# Patient Record
Sex: Male | Born: 1950 | Race: White | Hispanic: No | Marital: Married | Smoking: Current some day smoker
Health system: Southern US, Community
[De-identification: ages and names within clinical notes are randomized; demographics above are authoritative.]

## PROBLEM LIST (undated history)

## (undated) DIAGNOSIS — K4090 Unilateral inguinal hernia, without obstruction or gangrene, not specified as recurrent: Secondary | ICD-10-CM

## (undated) DIAGNOSIS — E785 Hyperlipidemia, unspecified: Secondary | ICD-10-CM

## (undated) DIAGNOSIS — M549 Dorsalgia, unspecified: Secondary | ICD-10-CM

## (undated) HISTORY — DX: Hyperlipidemia, unspecified: E78.5

## (undated) HISTORY — PX: HERNIA REPAIR: SHX51

## (undated) HISTORY — PX: FINGER SURGERY: SHX640

---

## 2012-11-30 ENCOUNTER — Encounter: Payer: Self-pay | Admitting: Family Medicine

## 2012-11-30 ENCOUNTER — Ambulatory Visit (INDEPENDENT_AMBULATORY_CARE_PROVIDER_SITE_OTHER): Payer: Federal, State, Local not specified - PPO | Admitting: Family Medicine

## 2012-11-30 VITALS — BP 132/73 | HR 89 | Ht 69.0 in | Wt 165.0 lb

## 2012-11-30 DIAGNOSIS — M545 Low back pain: Secondary | ICD-10-CM

## 2012-11-30 DIAGNOSIS — G8929 Other chronic pain: Secondary | ICD-10-CM

## 2012-11-30 MED ORDER — NORTRIPTYLINE HCL 25 MG PO CAPS: 50.0000 mg | ORAL_CAPSULE | Freq: Every day | ORAL | Status: DC

## 2012-11-30 NOTE — Patient Instructions (Signed)
You have chronic low back pain. You've done a majority of the things typically prescribed for this (physical therapy, epidural steroid injections, anti-inflammatories, muscle relaxants, pain medication, discussed surgery). We will try nortriptyline 25mg  at bedtime - in 1 week if you tolerate this can increase to taking 2 capsules at bedtime. Call me for a refill of this because I can send in the higher dose. Take tylenol for baseline pain relief (1-2 extra strength tabs 3x/day) Stay as active as possible. Would still encourage you to do home exercises daily that physical therapy had shown you. Consider repeating the injections in your back, pain management referral. Follow up with me in 1 month.

## 2012-12-11 ENCOUNTER — Encounter: Payer: Self-pay | Admitting: Family Medicine

## 2012-12-11 DIAGNOSIS — G8929 Other chronic pain: Secondary | ICD-10-CM | POA: Insufficient documentation

## 2012-12-11 NOTE — Progress Notes (Signed)
Patient ID: Brendan Caldwell, male   DOB: 08-19-1950, 62 y.o.   MRN: [id number]  PCP: No primary provider on file.  Subjective:   HPI: Patient is a 62 y.o. male here for chronic low back pain.  Patient has had low back pain dating to October 24, 2005. He was caught on a machine chain at work and subsequently twisted his low back. Had radiation and numbness/tingling into right leg with electrical shock sensation. No prior issues with his back Over the past several years he has had extensive workup and treatment of this issue. This included over 4 years of physical therapy, ibuprofen, advil, vicodin, flexeril, darvocet, tramadol, medrol, valium, ESI series x 4, home TENS unit.  They recommended lyrica but was not approved by workers comp. Had 3 separate MRIs; November 26, 2005: partially lumbarized S1, slight retrolisthesis of L3 over L4, L2-3 mild degeneration with disc bulging into inferior neural foramen.  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 August 14, 2007: mild annular bulge at L4-5, small left paracentral disc herniation at L5-S1 contacting left S1 nerve root. January 08, 2009: multilevel DDD.  Small central herniation at L5-S1 but no obvious nerve root impingement or foraminal stenosis. Currently still experiences pain daily around 5-6/10 up to a 10/10 at times. Has to keep changing positions. Still only radiates into his right leg. No numbness or tingling now. Tramadol made him sick to his stomach. No bowel/bladder dysfunction, saddle anesthesia.  Past Medical History  Diagnosis Date  . Hyperlipidemia     No current outpatient prescriptions on file prior to visit.   No current facility-administered medications on file prior to visit.    Past Surgical History  Procedure Laterality Date  . Finger surgery      No Known Allergies  History   Social History  . Marital Status: Unknown    Spouse Name: N/A     Number of Children: N/A  . Years of Education: N/A   Occupational History  . Not on file.   Social History Main Topics  . Smoking status: Current Every Day Smoker  . Smokeless tobacco: Not on file  . Alcohol Use: Not on file  . Drug Use: Not on file  . Sexual Activity: Not on file   Other Topics Concern  . Not on file   Social History Narrative  . No narrative on file    Family History  Problem Relation Age of Onset  . Sudden death Neg Hx   . Hypertension Neg Hx   . Hyperlipidemia Neg Hx   . Heart attack Neg Hx   . Diabetes Neg Hx     BP 132/73  Pulse 89  Ht 5\' 9"  (1.753 m)  Wt 165 lb (74.844 kg)  BMI 24.36 kg/m2  Review of Systems: See HPI above.    Objective:  Physical Exam:  Gen: NAD  Back: No gross deformity, scoliosis. TTP mildly bilateral paraspinal regions..  No midline or bony TTP. FROM with pain on flexion. Strength LEs 5/5 all muscle groups.   2+ MSRs in patellar and achilles tendons, equal bilaterally. Negative SLRs. Sensation intact to light touch bilaterally. Negative logroll bilateral hips Negative fabers and piriformis stretches.    Assessment & Plan:  1. Chronic low back pain - multilevel DDD - does have small disc herniation L5-S1 though that MRI was done 4 years ago, likely changes to this point.  Has had extensive treatment for this including PT, nsaids,  ESIs, muscle relaxanats, pain medication, discussion about surgery.  He has not tried neuropathic medications.  Will start nortriptyline.  Tylenol.  Declined repeating other measures at this time.  Continue home exercises.  Consider pain management referral.  F/u in 1 month.

## 2012-12-11 NOTE — Assessment & Plan Note (Signed)
multilevel DDD - does have small disc herniation L5-S1 though that MRI was done 4 years ago, likely changes to this point.  Has had extensive treatment for this including PT, nsaids, ESIs, muscle relaxanats, pain medication, discussion about surgery.  He has not tried neuropathic medications.  Will start nortriptyline.  Tylenol.  Declined repeating other measures at this time.  Continue home exercises.  Consider pain management referral.  F/u in 1 month.

## 2013-01-01 ENCOUNTER — Ambulatory Visit: Payer: Self-pay | Admitting: Family Medicine

## 2013-03-16 ENCOUNTER — Encounter: Payer: Self-pay | Admitting: Family Medicine

## 2013-03-16 ENCOUNTER — Ambulatory Visit (INDEPENDENT_AMBULATORY_CARE_PROVIDER_SITE_OTHER): Payer: Self-pay | Admitting: Family Medicine

## 2013-03-16 VITALS — BP 148/98 | HR 92 | Ht 69.0 in | Wt 163.8 lb

## 2013-03-16 DIAGNOSIS — M545 Low back pain, unspecified: Secondary | ICD-10-CM | POA: Diagnosis not present

## 2013-03-16 DIAGNOSIS — G8929 Other chronic pain: Secondary | ICD-10-CM

## 2013-03-16 NOTE — Patient Instructions (Signed)
You have chronic low back pain. You've done a majority of the things typically prescribed for this (physical therapy, epidural steroid injections, anti-inflammatories, muscle relaxants, pain medication, discussed surgery). Start nortriptyline 25mg  at bedtime - in 1 week if you tolerate this can increase to taking 2 capsules at bedtime. Call me for a refill of this because I can send in the higher dose. Take tylenol, advil for baseline pain relief (1-2 extra strength tabs 3x/day) Stay as active as possible. Would still encourage you to do home exercises daily that physical therapy had shown you. Consider repeating the injections in your back, pain management referral.

## 2013-03-20 ENCOUNTER — Encounter: Payer: Self-pay | Admitting: Family Medicine

## 2013-03-20 NOTE — Assessment & Plan Note (Signed)
multilevel DDD - does have small disc herniation L5-S1 though that MRI was done 4 years ago, likely changes to this point.  As noted before he has had extensive treatment for this including PT, nsaids, ESIs, muscle relaxanats, pain medication, discussion about surgery.  After further talking about nortriptyline he is willing to try this now.  Continue HEP.  Consider pain management referral, repeating MRI and/or ESIs.  F/u prn.

## 2013-03-20 NOTE — Progress Notes (Signed)
Patient ID: Brendan Caldwell, male   DOB: 1950-04-26, 63 y.o.   MRN: [id number] Patient ID: Brendan Caldwell, male   DOB: Feb 06, 1950, 63 y.o.   MRN: [id number]  PCP: No primary provider on file.  Subjective:   HPI: Patient is a 63 y.o. male here for chronic low back pain.  11/30/12: Patient has had low back pain dating to October 24, 2005. He was caught on a machine chain at work and subsequently twisted his low back. Had radiation and numbness/tingling into right leg with electrical shock sensation. No prior issues with his back Over the past several years he has had extensive workup and treatment of this issue. This included over 4 years of physical therapy, ibuprofen, advil, vicodin, flexeril, darvocet, tramadol, medrol, valium, ESI series x 4, home TENS unit.  They recommended lyrica but was not approved by workers comp. Had 3 separate MRIs; November 26, 2005: partially lumbarized S1, slight retrolisthesis of L3 over L4, L2-3 mild degeneration with disc bulging into inferior neural foramen.  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 August 14, 2007: mild annular bulge at L4-5, small left paracentral disc herniation at L5-S1 contacting left S1 nerve root. January 08, 2009: multilevel DDD.  Small central herniation at L5-S1 but no obvious nerve root impingement or foraminal stenosis. Currently still experiences pain daily around 5-6/10 up to a 10/10 at times. Has to keep changing positions. Still only radiates into his right leg. No numbness or tingling now. Tramadol made him sick to his stomach. No bowel/bladder dysfunction, saddle anesthesia.  03/16/13: Patient reports he hasn't had much change in back pain. Has not taken nortriptyline - family member scared him about this medication. Feels best in evening. Worse while working. Uses advil and TENS unit. Has to take frequent breaks. Pain sometimes into right leg. No  numbness/tingling. No bowel/bladder dysfunction.  Past Medical History  Diagnosis Date  . Hyperlipidemia     Current Outpatient Prescriptions on File Prior to Visit  Medication Sig Dispense Refill  . nortriptyline (PAMELOR) 25 MG capsule Take 2 capsules (50 mg total) by mouth at bedtime.  60 capsule  0   No current facility-administered medications on file prior to visit.    Past Surgical History  Procedure Laterality Date  . Finger surgery      No Known Allergies  History   Social History  . Marital Status: Unknown    Spouse Name: N/A    Number of Children: N/A  . Years of Education: N/A   Occupational History  . Not on file.   Social History Main Topics  . Smoking status: Current Every Day Smoker  . Smokeless tobacco: Not on file  . Alcohol Use: Not on file  . Drug Use: Not on file  . Sexual Activity: Not on file   Other Topics Concern  . Not on file   Social History Narrative  . No narrative on file    Family History  Problem Relation Age of Onset  . Sudden death Neg Hx   . Hypertension Neg Hx   . Hyperlipidemia Neg Hx   . Heart attack Neg Hx   . Diabetes Neg Hx     BP 148/98  Pulse 92  Ht 5\' 9"  (1.753 m)  Wt 163 lb 12.8 oz (74.299 kg)  BMI 24.18 kg/m2  Review of Systems: See HPI above.    Objective:  Physical Exam:  Gen: NAD  Back: No gross deformity, scoliosis.  TTP mildly bilateral paraspinal regions..  No midline or bony TTP. FROM with pain on flexion. Strength LEs 5/5 all muscle groups.   2+ MSRs in patellar and achilles tendons, equal bilaterally. Negative SLRs. Sensation intact to light touch bilaterally. Negative logroll bilateral hips Negative fabers and piriformis stretches.    Assessment & Plan:  1. Chronic low back pain - multilevel DDD - does have small disc herniation L5-S1 though that MRI was done 4 years ago, likely changes to this point.  As noted before he has had extensive treatment for this including PT, nsaids,  ESIs, muscle relaxanats, pain medication, discussion about surgery.  After further talking about nortriptyline he is willing to try this now.  Continue HEP.  Consider pain management referral, repeating MRI and/or ESIs.  F/u prn.

## 2013-03-25 ENCOUNTER — Emergency Department (HOSPITAL_BASED_OUTPATIENT_CLINIC_OR_DEPARTMENT_OTHER)
Admission: EM | Admit: 2013-03-25 | Discharge: 2013-03-25 | Disposition: A | Discharge status: Home / Self Care | Payer: Medicare Other | Attending: Emergency Medicine | Admitting: Emergency Medicine

## 2013-03-25 ENCOUNTER — Emergency Department (HOSPITAL_BASED_OUTPATIENT_CLINIC_OR_DEPARTMENT_OTHER): Payer: Medicare Other

## 2013-03-25 DIAGNOSIS — F17200 Nicotine dependence, unspecified, uncomplicated: Secondary | ICD-10-CM | POA: Insufficient documentation

## 2013-03-25 DIAGNOSIS — F29 Unspecified psychosis not due to a substance or known physiological condition: Secondary | ICD-10-CM | POA: Diagnosis not present

## 2013-03-25 DIAGNOSIS — B349 Viral infection, unspecified: Secondary | ICD-10-CM

## 2013-03-25 DIAGNOSIS — B9789 Other viral agents as the cause of diseases classified elsewhere: Secondary | ICD-10-CM | POA: Diagnosis not present

## 2013-03-25 DIAGNOSIS — Z8639 Personal history of other endocrine, nutritional and metabolic disease: Secondary | ICD-10-CM | POA: Insufficient documentation

## 2013-03-25 DIAGNOSIS — Z862 Personal history of diseases of the blood and blood-forming organs and certain disorders involving the immune mechanism: Secondary | ICD-10-CM | POA: Insufficient documentation

## 2013-03-25 DIAGNOSIS — R197 Diarrhea, unspecified: Secondary | ICD-10-CM | POA: Insufficient documentation

## 2013-03-25 DIAGNOSIS — R5383 Other fatigue: Secondary | ICD-10-CM | POA: Diagnosis not present

## 2013-03-25 DIAGNOSIS — R5381 Other malaise: Secondary | ICD-10-CM | POA: Diagnosis not present

## 2013-03-25 DIAGNOSIS — R4182 Altered mental status, unspecified: Secondary | ICD-10-CM | POA: Diagnosis not present

## 2013-03-25 DIAGNOSIS — R918 Other nonspecific abnormal finding of lung field: Secondary | ICD-10-CM | POA: Diagnosis not present

## 2013-03-25 LAB — CBC WITH DIFFERENTIAL/PLATELET
Basophils Absolute: 0 10*3/uL (ref 0.0–0.1)
Basophils Relative: 0 % (ref 0–1)
Eosinophils Absolute: 0.3 10*3/uL (ref 0.0–0.7)
Eosinophils Relative: 6 % — ABNORMAL HIGH (ref 0–5)
HEMATOCRIT: 44.8 % (ref 39.0–52.0)
HEMOGLOBIN: 15.8 g/dL (ref 13.0–17.0)
LYMPHS PCT: 26 % (ref 12–46)
Lymphs Abs: 1.4 10*3/uL (ref 0.7–4.0)
MCH: 31.7 pg (ref 26.0–34.0)
MCHC: 35.3 g/dL (ref 30.0–36.0)
MCV: 89.8 fL (ref 78.0–100.0)
MONO ABS: 0.7 10*3/uL (ref 0.1–1.0)
MONOS PCT: 12 % (ref 3–12)
NEUTROS ABS: 3.1 10*3/uL (ref 1.7–7.7)
Neutrophils Relative %: 56 % (ref 43–77)
Platelets: 127 10*3/uL — ABNORMAL LOW (ref 150–400)
RBC: 4.99 MIL/uL (ref 4.22–5.81)
RDW: 12.8 % (ref 11.5–15.5)
WBC: 5.5 10*3/uL (ref 4.0–10.5)

## 2013-03-25 LAB — COMPREHENSIVE METABOLIC PANEL
ALT: 17 U/L (ref 0–53)
AST: 19 U/L (ref 0–37)
Albumin: 3.7 g/dL (ref 3.5–5.2)
Alkaline Phosphatase: 59 U/L (ref 39–117)
BILIRUBIN TOTAL: 0.3 mg/dL (ref 0.3–1.2)
BUN: 15 mg/dL (ref 6–23)
CHLORIDE: 102 meq/L (ref 96–112)
CO2: 25 meq/L (ref 19–32)
Calcium: 9.1 mg/dL (ref 8.4–10.5)
Creatinine, Ser: 0.8 mg/dL (ref 0.50–1.35)
GFR calc Af Amer: >90 mL/min (ref >90)
Glucose, Bld: 101 mg/dL — ABNORMAL HIGH (ref 70–99)
Potassium: 4.3 mEq/L (ref 3.7–5.3)
Sodium: 139 mEq/L (ref 137–147)
Total Protein: 6.9 g/dL (ref 6.0–8.3)

## 2013-03-25 LAB — TROPONIN I

## 2013-03-25 MED ORDER — SODIUM CHLORIDE 0.9 % IV BOLUS (SEPSIS)
1000.0000 mL | Freq: Once | INTRAVENOUS | Status: AC
  Administered 2013-03-25: 1000 mL via INTRAVENOUS

## 2013-03-25 NOTE — ED Provider Notes (Signed)
CSN: [id number]     Arrival date & time 03/25/13  2725 History   First MD Initiated Contact with Patient 03/25/13 [phone]     Chief Complaint  Patient presents with  . Influenza     (Consider location/radiation/quality/duration/timing/severity/associated sxs/prior Treatment) HPI Comments: Patient is a 63 year old male with past medical history of high cholesterol. He presents today with complaints of a several day history of not feeling well. He states he has had generalized body aches and chills several days ago he experienced recurrent episodes of diarrhea and also an episode of chest discomfort. He has felt fevered off and on but has not taken his temperature. He woke from sleep this morning stating that he felt "confused". He stated he is having difficulty identifying his surroundings and was uncertain as to where he was. He woke his wife up who in turn brought him here. He denies to me he is having any headache, fever, or stiff neck. He denies any weakness or numbness in his arms or legs. He states he feels as though he was "by a truck".  Patient is a 63 y.o. male presenting with flu symptoms. The history is provided by the patient.  Influenza Presenting symptoms: cough, diarrhea, fatigue and headache   Presenting symptoms: no fever   Severity:  Moderate Onset quality:  Sudden Duration:  3 days Progression:  Worsening Chronicity:  New Associated symptoms: chills     Past Medical History  Diagnosis Date  . Hyperlipidemia    Past Surgical History  Procedure Laterality Date  . Finger surgery     Family History  Problem Relation Age of Onset  . Sudden death Neg Hx   . Hypertension Neg Hx   . Hyperlipidemia Neg Hx   . Heart attack Neg Hx   . Diabetes Neg Hx    History  Substance Use Topics  . Smoking status: Current Every Day Smoker  . Smokeless tobacco: Not on file  . Alcohol Use: Not on file    Review of Systems  Constitutional: Positive for chills and fatigue. Negative  for fever.  Respiratory: Positive for cough.   Gastrointestinal: Positive for diarrhea.  Neurological: Positive for headaches.  All other systems reviewed and are negative.      Allergies  Review of patient's allergies indicates no known allergies.  Home Medications   Current Outpatient Rx  Name  Route  Sig  Dispense  Refill  . nortriptyline (PAMELOR) 25 MG capsule   Oral   Take 2 capsules (50 mg total) by mouth at bedtime.   60 capsule   0    BP 118/69  Pulse 93  Temp(Src) 97 F (36.1 C) (Oral)  Resp 20  Ht 5\' 9"  (1.753 m)  Wt 167 lb (75.751 kg)  BMI 24.65 kg/m2  SpO2 97% Physical Exam  Nursing note and vitals reviewed. Constitutional: He is oriented to person, place, and time. He appears well-developed and well-nourished. No distress.  HENT:  Head: Normocephalic and atraumatic.  Mouth/Throat: Oropharynx is clear and moist.  Eyes: EOM are normal. Pupils are equal, round, and reactive to light.  Neck: Normal range of motion. Neck supple.  Cardiovascular: Normal rate, regular rhythm and normal heart sounds.   No murmur heard. Pulmonary/Chest: Effort normal and breath sounds normal. No respiratory distress. He has no wheezes.  Abdominal: Soft. Bowel sounds are normal. He exhibits no distension. There is no tenderness.  Musculoskeletal: Normal range of motion. He exhibits no edema.  Lymphadenopathy:    He  has no cervical adenopathy.  Neurological: He is alert and oriented to person, place, and time. No cranial nerve deficit. He exhibits normal muscle tone. Coordination normal.  Skin: Skin is warm and dry. He is not diaphoretic.    ED Course  Procedures (including critical care time) Labs Review Labs Reviewed  CBC WITH DIFFERENTIAL  COMPREHENSIVE METABOLIC PANEL  TROPONIN I   Imaging Review No results found.  EKG Interpretation    Date/Time:  Sunday March 25 2013 04:12:48 EST Ventricular Rate:  80 PR Interval:  146 QRS Duration: 84 QT  Interval:  350 QTC Calculation: 403 R Axis:   67 Text Interpretation:  Normal sinus rhythm Normal ECG Confirmed by DELOS  MD, Palyn Scrima ([id number]) on 03/25/2013 4:23:24 AM            MDM   Final diagnoses:  None    Patient is a 64 year old male no significant past medical history. He presents with complaints of generalized malaise, some diarrhea, and an episode of confusion that occurred when awakening was morning to go to the bathroom. His neurologic examination is nonfocal and workup is unremarkable. There is no elevation of white count, electrolytes are normal, CT of the head is unremarkable and chest x-ray is clear. I suspect his symptoms are viral in nature and will recommend plenty of fluids, rest, and time. He is to followup next week with his primary Dr. And return to the ER if his symptoms substantially worsen or change.    Veryl Speak, MD 03/25/13 [phone]

## 2013-03-25 NOTE — Discharge Instructions (Signed)
Drink plenty of fluids and get plenty of rest for the next several days. Return to the emergency department if you develop high fever, severe headache, or other new and concerning symptoms.   Viral Infections A viral infection can be caused by different types of viruses.Most viral infections are not serious and resolve on their own. However, some infections may cause severe symptoms and may lead to further complications. SYMPTOMS Viruses can frequently cause:  Minor sore throat.  Aches and pains.  Headaches.  Runny nose.  Different types of rashes.  Watery eyes.  Tiredness.  Cough.  Loss of appetite.  Gastrointestinal infections, resulting in nausea, vomiting, and diarrhea. These symptoms do not respond to antibiotics because the infection is not caused by bacteria. However, you might catch a bacterial infection following the viral infection. This is sometimes called a "superinfection." Symptoms of such a bacterial infection may include:  Worsening sore throat with pus and difficulty swallowing.  Swollen neck glands.  Chills and a high or persistent fever.  Severe headache.  Tenderness over the sinuses.  Persistent overall ill feeling (malaise), muscle aches, and tiredness (fatigue).  Persistent cough.  Yellow, green, or brown mucus production with coughing. HOME CARE INSTRUCTIONS   Only take over-the-counter or prescription medicines for pain, discomfort, diarrhea, or fever as directed by your caregiver.  Drink enough water and fluids to keep your urine clear or pale yellow. Sports drinks can provide valuable electrolytes, sugars, and hydration.  Get plenty of rest and maintain proper nutrition. Soups and broths with crackers or rice are fine. SEEK IMMEDIATE MEDICAL CARE IF:   You have severe headaches, shortness of breath, chest pain, neck pain, or an unusual rash.  You have uncontrolled vomiting, diarrhea, or you are unable to keep down fluids.  You or  your child has an oral temperature above 102 F (38.9 C), not controlled by medicine.  Your baby is older than 3 months with a rectal temperature of 102 F (38.9 C) or higher.  Your baby is 65 months old or younger with a rectal temperature of 100.4 F (38 C) or higher. MAKE SURE YOU:   Understand these instructions.  Will watch your condition.  Will get help right away if you are not doing well or get worse. Document Released: 10/28/2004 Document Revised: 04/12/2011 Document Reviewed: 05/25/2010 [facility] Patient Information 2014 [place], [place].

## 2013-03-25 NOTE — ED Notes (Signed)
Pt reports flu like symptoms x 1 week with fever body aches and chills  Awoke from sleep confused and disoriented

## 2013-03-30 ENCOUNTER — Emergency Department (HOSPITAL_BASED_OUTPATIENT_CLINIC_OR_DEPARTMENT_OTHER)
Admission: EM | Admit: 2013-03-30 | Discharge: 2013-03-30 | Disposition: A | Discharge status: Home / Self Care | Payer: Medicare Other | Attending: Emergency Medicine | Admitting: Emergency Medicine

## 2013-03-30 ENCOUNTER — Encounter (HOSPITAL_BASED_OUTPATIENT_CLINIC_OR_DEPARTMENT_OTHER): Payer: Self-pay | Admitting: Emergency Medicine

## 2013-03-30 DIAGNOSIS — Z79899 Other long term (current) drug therapy: Secondary | ICD-10-CM | POA: Diagnosis not present

## 2013-03-30 DIAGNOSIS — F17200 Nicotine dependence, unspecified, uncomplicated: Secondary | ICD-10-CM | POA: Diagnosis not present

## 2013-03-30 DIAGNOSIS — Z8639 Personal history of other endocrine, nutritional and metabolic disease: Secondary | ICD-10-CM | POA: Diagnosis not present

## 2013-03-30 DIAGNOSIS — Z862 Personal history of diseases of the blood and blood-forming organs and certain disorders involving the immune mechanism: Secondary | ICD-10-CM | POA: Diagnosis not present

## 2013-03-30 DIAGNOSIS — L408 Other psoriasis: Secondary | ICD-10-CM | POA: Insufficient documentation

## 2013-03-30 DIAGNOSIS — K625 Hemorrhage of anus and rectum: Secondary | ICD-10-CM

## 2013-03-30 LAB — CBC
HCT: 42.4 % (ref 39.0–52.0)
HEMOGLOBIN: 15.1 g/dL (ref 13.0–17.0)
MCH: 32 pg (ref 26.0–34.0)
MCHC: 35.6 g/dL (ref 30.0–36.0)
MCV: 89.8 fL (ref 78.0–100.0)
Platelets: 154 10*3/uL (ref 150–400)
RBC: 4.72 MIL/uL (ref 4.22–5.81)
RDW: 12.6 % (ref 11.5–15.5)
WBC: 10.9 10*3/uL — AB (ref 4.0–10.5)

## 2013-03-30 LAB — OCCULT BLOOD X 1 CARD TO LAB, STOOL: FECAL OCCULT BLD: POSITIVE — AB

## 2013-03-30 NOTE — ED Notes (Addendum)
Pt c/o rectal bleeding bright red and dark red since this morning, denies pain. Pt sts bleeding was present 2wks ago also and resolved after 2 days. Pt has never had a colonoscopy. Pt sts he continues to feel tired but sts he had the flu recently. Pt also reports starting new medication 5 days ago.

## 2013-03-30 NOTE — Discharge Instructions (Signed)
Gastrointestinal Bleeding °Gastrointestinal (GI) bleeding means there is bleeding somewhere along the digestive tract, between the mouth and anus. °CAUSES  °There are many different problems that can cause GI bleeding. Possible causes include: °· Esophagitis. This is inflammation, irritation, or swelling of the esophagus. °· Hemorrhoids. These are veins that are full of blood (engorged) in the rectum. They cause pain, inflammation, and may bleed. °· Anal fissures. These are areas of painful tearing which may bleed. They are often caused by passing hard stool. °· Diverticulosis. These are pouches that form on the colon over time, with age, and may bleed significantly. °· Diverticulitis. This is inflammation in areas with diverticulosis. It can cause pain, fever, and bloody stools, although bleeding is rare. °· Polyps and cancer. Colon cancer often starts out as precancerous polyps. °· Gastritis and ulcers. Bleeding from the upper gastrointestinal tract (near the stomach) may travel through the intestines and produce black, sometimes tarry, often bad smelling stools. In certain cases, if the bleeding is fast enough, the stools may not be black, but red. This condition may be life-threatening. °SYMPTOMS  °· Vomiting bright red blood or material that looks like coffee grounds. °· Bloody, black, or tarry stools. °DIAGNOSIS  °Your caregiver may diagnose your condition by taking your history and performing a physical exam. More tests may be needed, including: °· X-rays and other imaging tests. °· Esophagogastroduodenoscopy (EGD). This test uses a flexible, lighted tube to look at your esophagus, stomach, and small intestine. °· Colonoscopy. This test uses a flexible, lighted tube to look at your colon. °TREATMENT  °Treatment depends on the cause of your bleeding.  °· For bleeding from the esophagus, stomach, small intestine, or colon, the caregiver doing your EGD or colonoscopy may be able to stop the bleeding as part of  the procedure. °· Inflammation or infection of the colon can be treated with medicines. °· Many rectal problems can be treated with creams, suppositories, or warm baths. °· Surgery is sometimes needed. °· Blood transfusions are sometimes needed if you have lost a lot of blood. °If bleeding is slow, you may be allowed to go home. If there is a lot of bleeding, you will need to stay in the hospital for observation. °HOME CARE INSTRUCTIONS  °· Take any medicines exactly as prescribed. °· Keep your stools soft by eating foods that are high in fiber. These foods include whole grains, legumes, fruits, and vegetables. Prunes (1 to 3 a day) work well for many people. °· Drink enough fluids to keep your urine clear or pale yellow. °SEEK IMMEDIATE MEDICAL CARE IF:  °· Your bleeding increases. °· You feel lightheaded, weak, or you faint. °· You have severe cramps in your back or abdomen. °· You pass large blood clots in your stool. °· Your problems are getting worse. °MAKE SURE YOU:  °· Understand these instructions. °· Will watch your condition. °· Will get help right away if you are not doing well or get worse. °Document Released: 01/16/2000 Document Revised: 01/05/2012 Document Reviewed: 12/28/2010 °[employer]® Patient Information ©2014 [employer]. ° °

## 2013-03-30 NOTE — ED Provider Notes (Signed)
CSN: [id number]     Arrival date & time 03/30/13  1701 History   First MD Initiated Contact with Patient 03/30/13 1703     Chief Complaint  Patient presents with  . Rectal Bleeding     (Consider location/radiation/quality/duration/timing/severity/associated sxs/prior Treatment) Patient is a 63 y.o. male presenting with hematochezia.  Rectal Bleeding Associated symptoms: no abdominal pain and no vomiting    patient states that he is has had some rectal bleeding for a month. He states it is sometimes red blood and sometimes a little darker. He states he thinks it is mixed with stool, however he does not even like looking at. He states his been worse over last couple days. No other bleeding. He states he's also had some fatigue. He states he has a previous history hemorrhoids. He states he recently had the flu it is not felt better since. No hematemesis. No cough.  Past Medical History  Diagnosis Date  . Hyperlipidemia    Past Surgical History  Procedure Laterality Date  . Finger surgery     Family History  Problem Relation Age of Onset  . Sudden death Neg Hx   . Hypertension Neg Hx   . Hyperlipidemia Neg Hx   . Heart attack Neg Hx   . Diabetes Neg Hx    History  Substance Use Topics  . Smoking status: Current Every Day Smoker  . Smokeless tobacco: Not on file  . Alcohol Use: No    Review of Systems  Constitutional: Positive for fatigue. Negative for activity change and appetite change.  Eyes: Negative for pain.  Respiratory: Negative for chest tightness and shortness of breath.   Cardiovascular: Negative for chest pain and leg swelling.  Gastrointestinal: Positive for blood in stool, hematochezia and anal bleeding. Negative for nausea, vomiting, abdominal pain, diarrhea and rectal pain.  Genitourinary: Negative for flank pain.  Musculoskeletal: Negative for back pain and neck stiffness.  Skin: Negative for pallor and rash.  Neurological: Negative for weakness, numbness  and headaches.  Psychiatric/Behavioral: Negative for behavioral problems.      Allergies  Review of patient's allergies indicates no known allergies.  Home Medications   Current Outpatient Rx  Name  Route  Sig  Dispense  Refill  . nortriptyline (PAMELOR) 25 MG capsule   Oral   Take 2 capsules (50 mg total) by mouth at bedtime.   60 capsule   0    BP 151/83  Pulse 94  Temp(Src) 97.5 F (36.4 C) (Oral)  Resp 18  SpO2 100% Physical Exam  Constitutional: He appears well-developed and well-nourished.  HENT:  Head: Normocephalic.  Cardiovascular: Normal rate and regular rhythm.   Pulmonary/Chest: Effort normal and breath sounds normal.  Abdominal: Soft. There is no tenderness.  Musculoskeletal: Normal range of motion.  Neurological: He is alert.  Skin: Rash noted. No erythema.  Psoriasis   rectal exam shows some psoriasis in his gluteal area. No frank blood on rectal exam, however guaiac positive.  ED Course  Procedures (including critical care time) Labs Review Labs Reviewed  CBC - Abnormal; Notable for the following:    WBC 10.9 (*)    All other components within normal limits  OCCULT BLOOD X 1 CARD TO LAB, STOOL   Imaging Review No results found.  EKG Interpretation  None  MDM   Final diagnoses:  None    Patient comes with rectal bleeding over the last month. Hemoglobin reassuring and stable. Will discharge home with GI followup.  Jasper Riling. Alvino Chapel, MD 03/30/13 [phone]

## 2013-04-10 DIAGNOSIS — K921 Melena: Secondary | ICD-10-CM | POA: Diagnosis not present

## 2013-04-10 DIAGNOSIS — Z1211 Encounter for screening for malignant neoplasm of colon: Secondary | ICD-10-CM | POA: Diagnosis not present

## 2013-04-16 ENCOUNTER — Encounter (HOSPITAL_BASED_OUTPATIENT_CLINIC_OR_DEPARTMENT_OTHER): Payer: Self-pay | Admitting: Emergency Medicine

## 2013-04-16 DIAGNOSIS — F17200 Nicotine dependence, unspecified, uncomplicated: Secondary | ICD-10-CM | POA: Diagnosis not present

## 2013-04-16 DIAGNOSIS — K089 Disorder of teeth and supporting structures, unspecified: Secondary | ICD-10-CM | POA: Diagnosis not present

## 2013-04-16 DIAGNOSIS — K08409 Partial loss of teeth, unspecified cause, unspecified class: Secondary | ICD-10-CM | POA: Diagnosis not present

## 2013-04-16 DIAGNOSIS — Z8639 Personal history of other endocrine, nutritional and metabolic disease: Secondary | ICD-10-CM | POA: Insufficient documentation

## 2013-04-16 DIAGNOSIS — Z862 Personal history of diseases of the blood and blood-forming organs and certain disorders involving the immune mechanism: Secondary | ICD-10-CM | POA: Insufficient documentation

## 2013-04-16 NOTE — ED Notes (Signed)
Dental pain. States the left side of his face is swollen. He needs pain medication. States he has an appointment in the am at 9:30 with his dentist

## 2013-04-17 ENCOUNTER — Emergency Department (HOSPITAL_BASED_OUTPATIENT_CLINIC_OR_DEPARTMENT_OTHER)
Admission: EM | Admit: 2013-04-17 | Discharge: 2013-04-17 | Disposition: A | Discharge status: Home / Self Care | Payer: Medicare Other | Attending: Emergency Medicine | Admitting: Emergency Medicine

## 2013-04-17 DIAGNOSIS — K0889 Other specified disorders of teeth and supporting structures: Secondary | ICD-10-CM

## 2013-04-17 MED ORDER — HYDROCODONE-ACETAMINOPHEN 5-325 MG PO TABS
1.0000 | ORAL_TABLET | ORAL | Status: DC | PRN
Start: 2013-04-17 — End: 2016-06-25

## 2013-04-17 MED ORDER — PENICILLIN V POTASSIUM 250 MG PO TABS
500.0000 mg | ORAL_TABLET | Freq: Once | ORAL | Status: AC
  Administered 2013-04-17: 500 mg via ORAL
  Filled 2013-04-17: qty 2

## 2013-04-17 MED ORDER — PENICILLIN V POTASSIUM 500 MG PO TABS: 500.0000 mg | ORAL_TABLET | Freq: Three times a day (TID) | ORAL | Status: DC

## 2013-04-17 NOTE — ED Provider Notes (Signed)
Medical screening examination/treatment/procedure(s) were performed by non-physician practitioner and as supervising physician I was immediately available for consultation/collaboration.   EKG Interpretation None        Wynetta Fines, MD 04/17/13 [phone]

## 2013-04-17 NOTE — ED Provider Notes (Signed)
CSN: [id number]     Arrival date & time 04/16/13  2145 History   First MD Initiated Contact with Patient 04/17/13 0032     Chief Complaint  Patient presents with  . Dental Pain     (Consider location/radiation/quality/duration/timing/severity/associated sxs/prior Treatment) Patient is a 63 y.o. male presenting with tooth pain. The history is provided by the patient. No language interpreter was used.  Dental Pain Location:  Lower Lower teeth location:  19/LL 1st molar Severity:  Moderate Associated symptoms: facial swelling   Associated symptoms: no fever   Associated symptoms comment:  Pain at #19, site of crown placement that, per patient, did not undergo root canal prior to crowning. He reports facial swelling. No fever. Increasing pain over the last 2 days.   Past Medical History  Diagnosis Date  . Hyperlipidemia    Past Surgical History  Procedure Laterality Date  . Finger surgery     Family History  Problem Relation Age of Onset  . Sudden death Neg Hx   . Hypertension Neg Hx   . Hyperlipidemia Neg Hx   . Heart attack Neg Hx   . Diabetes Neg Hx    History  Substance Use Topics  . Smoking status: Current Every Day Smoker  . Smokeless tobacco: Not on file  . Alcohol Use: No    Review of Systems  Constitutional: Negative for fever and chills.  HENT: Positive for dental problem and facial swelling. Negative for ear pain and trouble swallowing.        Toothache.  Gastrointestinal: Negative.  Negative for nausea.  Musculoskeletal: Negative for myalgias.  Neurological: Negative.       Allergies  Review of patient's allergies indicates no known allergies.  Home Medications   Current Outpatient Rx  Name  Route  Sig  Dispense  Refill  . nortriptyline (PAMELOR) 25 MG capsule   Oral   Take 2 capsules (50 mg total) by mouth at bedtime.   60 capsule   0    BP 152/88  Pulse 87  Temp(Src) 97.7 F (36.5 C) (Oral)  Resp 20  Ht 5\' 9"  (1.753 m)  Wt 167 lb  (75.751 kg)  BMI 24.65 kg/m2  SpO2 100% Physical Exam  Constitutional: He is oriented to person, place, and time. He appears well-developed and well-nourished.  HENT:  Partially edentulous. #19 with moderate surrounding swelling, mild redness to gingiva. No pointing abscess. No facial swelling.   Neck: Normal range of motion.  Pulmonary/Chest: Effort normal.  Musculoskeletal: Normal range of motion.  Lymphadenopathy:    He has no cervical adenopathy.  Neurological: He is alert and oriented to person, place, and time.  Skin: Skin is warm and dry.  Psychiatric: He has a normal mood and affect.    ED Course  Procedures (including critical care time) Labs Review Labs Reviewed - No data to display Imaging Review No results found.   EKG Interpretation None      MDM   Final diagnoses:  None    1. Dental pain   Uncomplicated dental pain.    Dewaine Oats, PA-C 04/17/13 [phone]

## 2013-04-17 NOTE — Discharge Instructions (Signed)
Dental Care and Dentist Visits Dental care supports good overall health. Regular dental visits can also help you avoid dental pain, bleeding, infection, and other more serious health problems in the future. It is important to keep the mouth healthy because diseases in the teeth, gums, and other oral tissues can spread to other areas of the body. Some problems, such as diabetes, heart disease, and pre-term labor have been associated with poor oral health.  See your dentist every 6 months. If you experience emergency problems such as a toothache or broken tooth, go to the dentist right away. If you see your dentist regularly, you may catch problems early. It is easier to be treated for problems in the early stages.  WHAT TO EXPECT AT A DENTIST VISIT  Your dentist will look for many common oral health problems and recommend proper treatment. At your regular dental visit, you can expect:  Gentle cleaning of the teeth and gums. This includes scraping and polishing. This helps to remove the sticky substance around the teeth and gums (plaque). Plaque forms in the mouth shortly after eating. Over time, plaque hardens on the teeth as tartar. If tartar is not removed regularly, it can cause problems. Cleaning also helps remove stains.  Periodic X-rays. These pictures of the teeth and supporting bone will help your dentist assess the health of your teeth.  Periodic fluoride treatments. Fluoride is a natural mineral shown to help strengthen teeth. Fluoride treatmentinvolves applying a fluoride gel or varnish to the teeth. It is most commonly done in children.  Examination of the mouth, tongue, jaws, teeth, and gums to look for any oral health problems, such as:  Cavities (dental caries). This is decay on the tooth caused by plaque, sugar, and acid in the mouth. It is best to catch a cavity when it is small.  Inflammation of the gums caused by plaque buildup (gingivitis).  Problems with the mouth or malformed  or misaligned teeth.  Oral cancer or other diseases of the soft tissues or jaws. KEEP YOUR TEETH AND GUMS HEALTHY For healthy teeth and gums, follow these general guidelines as well as your dentist's specific advice:  Have your teeth professionally cleaned at the dentist every 6 months.  Brush twice daily with a fluoride toothpaste.  Floss your teeth daily.  Ask your dentist if you need fluoride supplements, treatments, or fluoride toothpaste.  Eat a healthy diet. Reduce foods and drinks with added sugar.  Avoid smoking. TREATMENT FOR ORAL HEALTH PROBLEMS If you have oral health problems, treatment varies depending on the conditions present in your teeth and gums.  Your caregiver will most likely recommend good oral hygiene at each visit.  For cavities, gingivitis, or other oral health disease, your caregiver will perform a procedure to treat the problem. This is typically done at a separate appointment. Sometimes your caregiver will refer you to another dental specialist for specific tooth problems or for surgery. SEEK IMMEDIATE DENTAL CARE IF:  You have pain, bleeding, or soreness in the gum, tooth, jaw, or mouth area.  A permanent tooth becomes loose or separated from the gum socket.  You experience a blow or injury to the mouth or jaw area. Document Released: 09/30/2010 Document Revised: 04/12/2011 Document Reviewed: 09/30/2010 [facility] Patient Information 2014 [place], [place].  Dental Pain A tooth ache may be caused by cavities (tooth decay). Cavities expose the nerve of the tooth to air and hot or cold temperatures. It may come from an infection or abscess (also called a  boil or furuncle) around your tooth. It is also often caused by dental caries (tooth decay). This causes the pain you are having. DIAGNOSIS  Your caregiver can diagnose this problem by exam. TREATMENT   If caused by an infection, it may be treated with medications which kill germs (antibiotics) and pain  medications as prescribed by your caregiver. Take medications as directed.  Only take over-the-counter or prescription medicines for pain, discomfort, or fever as directed by your caregiver.  Whether the tooth ache today is caused by infection or dental disease, you should see your dentist as soon as possible for further care. SEEK MEDICAL CARE IF: The exam and treatment you received today has been provided on an emergency basis only. This is not a substitute for complete medical or dental care. If your problem worsens or new problems (symptoms) appear, and you are unable to meet with your dentist, call or return to this location. SEEK IMMEDIATE MEDICAL CARE IF:   You have a fever.  You develop redness and swelling of your face, jaw, or neck.  You are unable to open your mouth.  You have severe pain uncontrolled by pain medicine. MAKE SURE YOU:   Understand these instructions.  Will watch your condition.  Will get help right away if you are not doing well or get worse. Document Released: 01/18/2005 Document Revised: 04/12/2011 Document Reviewed: 09/06/2007 [facility] Patient Information 2014 [place].

## 2013-05-23 DIAGNOSIS — K921 Melena: Secondary | ICD-10-CM | POA: Diagnosis not present

## 2013-05-23 DIAGNOSIS — K5730 Diverticulosis of large intestine without perforation or abscess without bleeding: Secondary | ICD-10-CM | POA: Diagnosis not present

## 2013-05-23 DIAGNOSIS — Z1211 Encounter for screening for malignant neoplasm of colon: Secondary | ICD-10-CM | POA: Diagnosis not present

## 2013-05-23 DIAGNOSIS — D126 Benign neoplasm of colon, unspecified: Secondary | ICD-10-CM | POA: Diagnosis not present

## 2015-01-04 IMAGING — CR DG CHEST 2V
2 series · 2 of 2 positions shown · non-contrast
Comparison: None available for comparison at time of study
interpretation.

CLINICAL DATA: Flu like symptoms for 1 week.

EXAM:
CHEST  2 VIEW

[w chest pa]
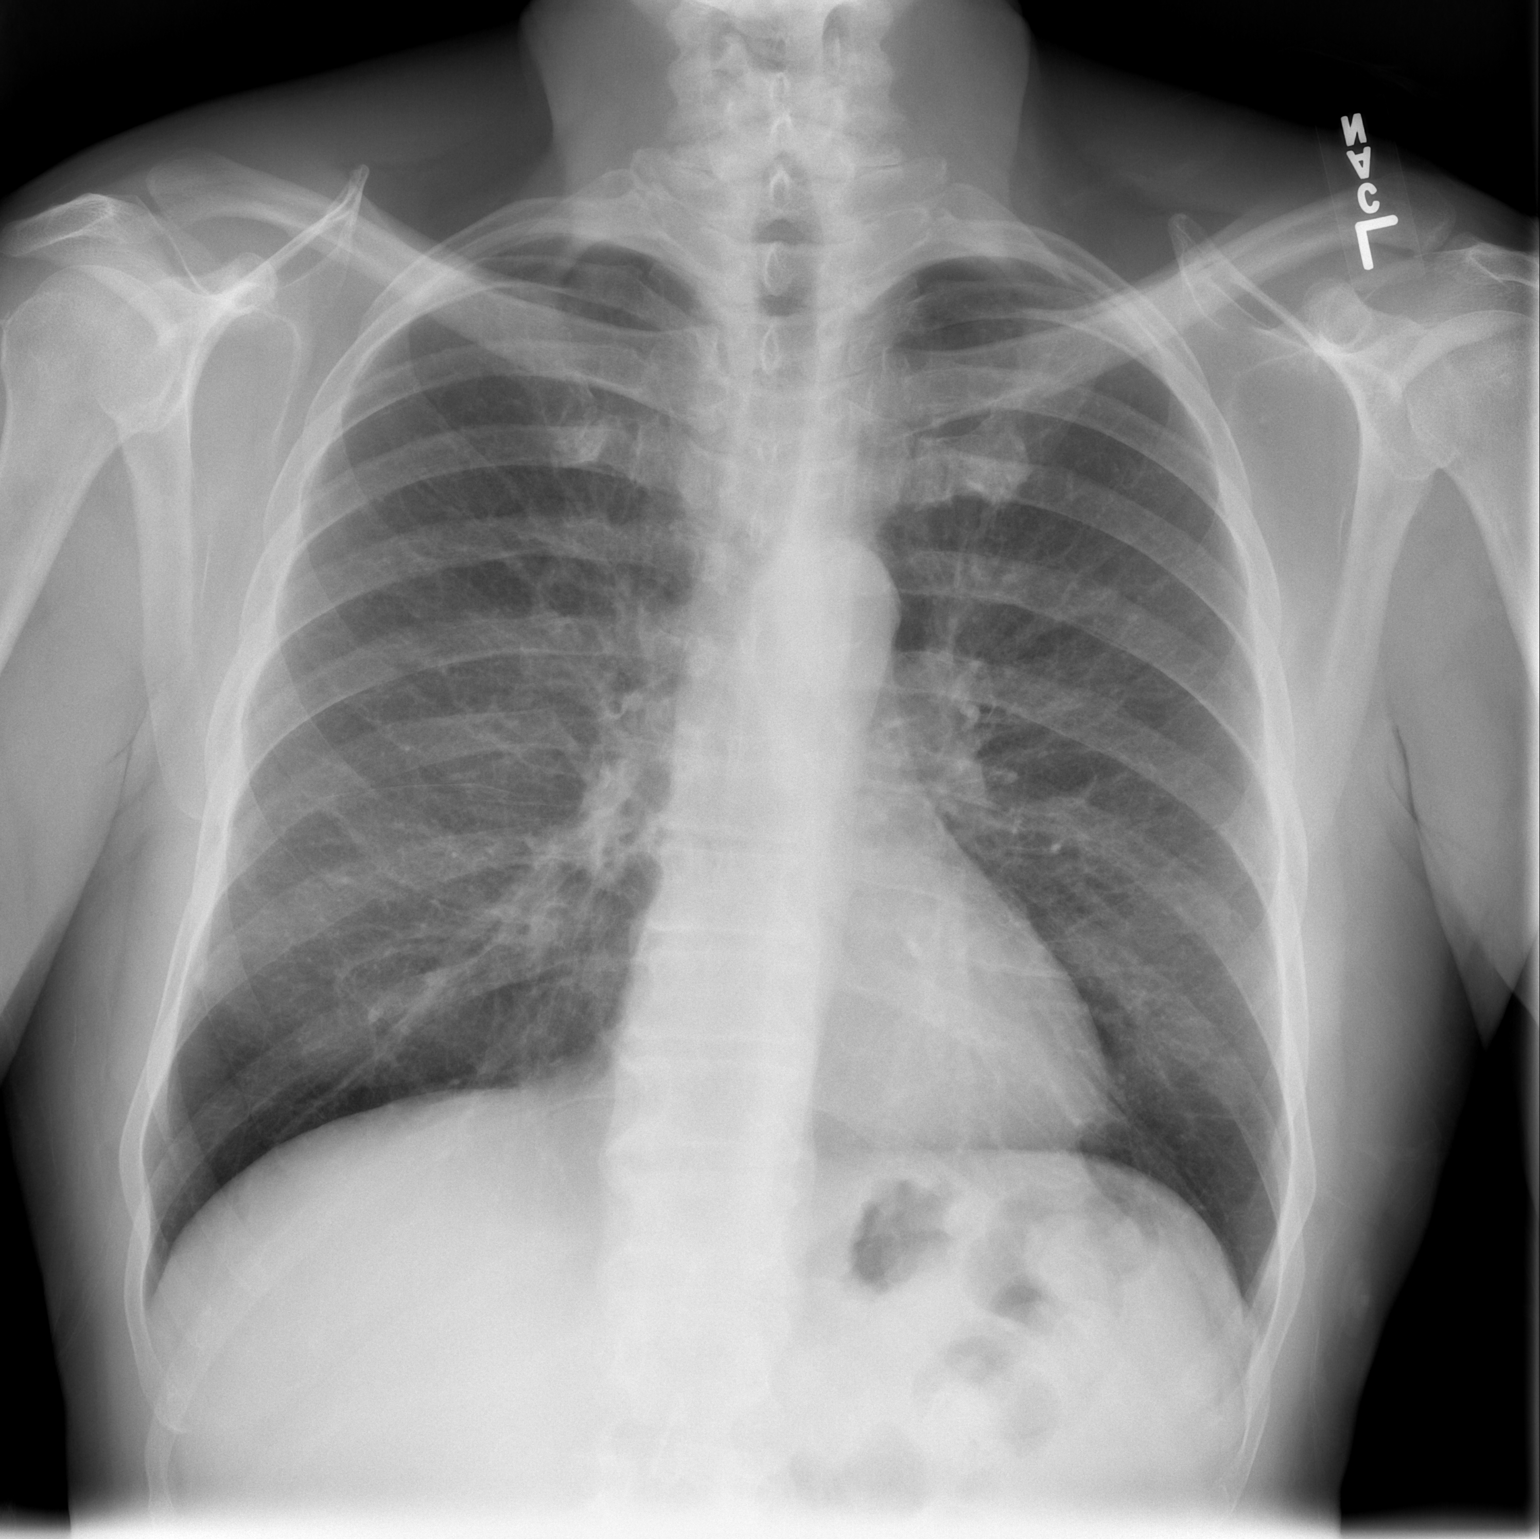

[w chest lat]
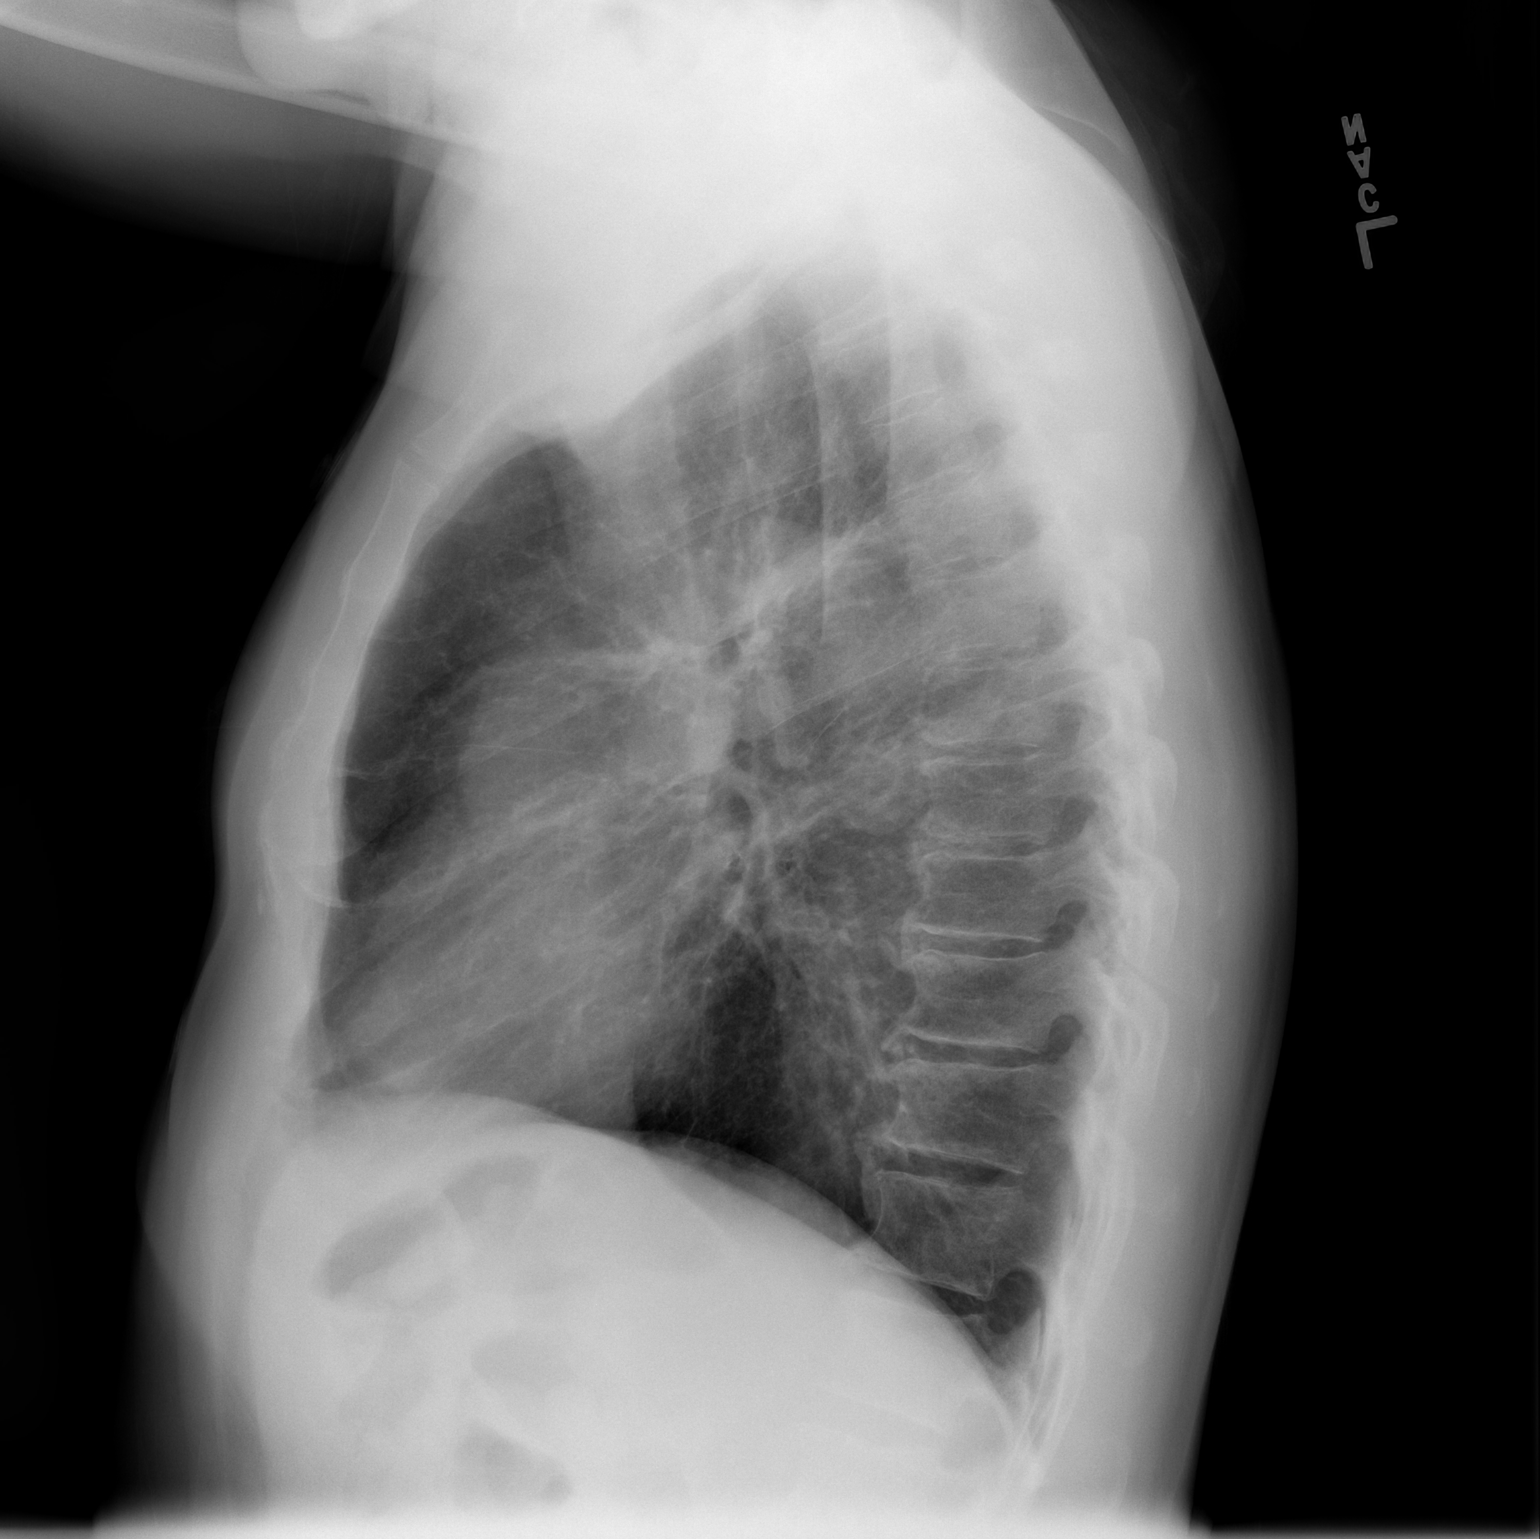

[2 of 2 positions shown; findings below may reference images not displayed]

FINDINGS: Cardiomediastinal silhouette is unremarkable. The lungs are clear
without pleural effusions or focal consolidations. Trachea projects
midline and there is no pneumothorax. Soft tissue planes and
included osseous structures are non-suspicious. Mild degenerative
change of the thoracic spine.
IMPRESSION: No acute cardiopulmonary process.

  By: Klever Jumper

## 2015-04-23 ENCOUNTER — Ambulatory Visit (INDEPENDENT_AMBULATORY_CARE_PROVIDER_SITE_OTHER): Payer: Medicare Other | Admitting: Family Medicine

## 2015-04-23 ENCOUNTER — Encounter (INDEPENDENT_AMBULATORY_CARE_PROVIDER_SITE_OTHER): Payer: Self-pay

## 2015-04-23 VITALS — BP 128/74 | HR 84 | Ht 69.0 in | Wt 165.0 lb

## 2015-04-23 DIAGNOSIS — M1990 Unspecified osteoarthritis, unspecified site: Secondary | ICD-10-CM | POA: Diagnosis not present

## 2015-04-23 DIAGNOSIS — M79642 Pain in left hand: Secondary | ICD-10-CM | POA: Diagnosis not present

## 2015-04-23 DIAGNOSIS — M19049 Primary osteoarthritis, unspecified hand: Secondary | ICD-10-CM

## 2015-04-23 NOTE — Patient Instructions (Signed)
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 These are the 4 classes of medicine you can take for this: Tylenol 500mg  1-2 tabs three times a day for pain. Aleve 1-2 tabs twice a day with food Glucosamine sulfate 750mg  twice a day is a supplement that may help. Capsaicin, aspercreme, or biofreeze topically up to four times a day may also help with pain. Cortisone injections are an option - you were given this today. Thumb spica brace can be helpful to rest this during flares. Heat or ice 15 minutes at a time 3-4 times a day as needed to help with pain. Follow up with me as needed.

## 2015-04-24 DIAGNOSIS — M1990 Unspecified osteoarthritis, unspecified site: Secondary | ICD-10-CM | POA: Diagnosis not present

## 2015-04-24 DIAGNOSIS — M79642 Pain in left hand: Secondary | ICD-10-CM

## 2015-04-24 MED ORDER — METHYLPREDNISOLONE ACETATE 40 MG/ML IJ SUSP
40.0000 mg | Freq: Once | INTRAMUSCULAR | Status: AC
  Administered 2015-04-24: 40 mg via INTRA_ARTICULAR

## 2015-04-25 ENCOUNTER — Encounter: Payer: Self-pay | Admitting: Family Medicine

## 2015-04-25 DIAGNOSIS — M19049 Primary osteoarthritis, unspecified hand: Secondary | ICD-10-CM | POA: Insufficient documentation

## 2015-04-25 NOTE — Assessment & Plan Note (Signed)
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  Tylenol, nsaids, glucosamine, topical medications.  Thumb spica brace if needed.  Heat/ice.  F/u prn.  After informed written consent patient was seated in chair in exam room.  Ultrasound used to identify and mark left 1st CMC.  Then area prepped with alcohol swab and injected with 0.5:0.50mL marcaine: depomedrol.  Patient tolerated procedure well without immediate complications.

## 2015-04-25 NOTE — Progress Notes (Signed)
PCP: Karlton Lemon, MD  Subjective:   HPI: Patient is a 65 y.o. male here for left thumb pain.  Patient reports he's had problems with left hand for 15 years.  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 + swelling. Using ACE wrap which helps. Pain is 5/10, sharp at base of the thumb. No skin changes, fever, other complaints.  Past Medical History  Diagnosis Date  . Hyperlipidemia     Current Outpatient Prescriptions on File Prior to Visit  Medication Sig Dispense Refill  . HYDROcodone-acetaminophen (NORCO/VICODIN) 5-325 MG per tablet Take 1-2 tablets by mouth every 4 (four) hours as needed. 12 tablet 0  . nortriptyline (PAMELOR) 25 MG capsule Take 2 capsules (50 mg total) by mouth at bedtime. 60 capsule 0  . penicillin v potassium (VEETID) 500 MG tablet Take 1 tablet (500 mg total) by mouth 3 (three) times daily. 30 tablet 0   No current facility-administered medications on file prior to visit.    Past Surgical History  Procedure Laterality Date  . Finger surgery      No Known Allergies  Social History   Social History  . Marital Status: Married    Spouse Name: N/A  . Number of Children: N/A  . Years of Education: N/A   Occupational History  . Not on file.   Social History Main Topics  . Smoking status: Current Every Day Smoker  . Smokeless tobacco: Not on file  . Alcohol Use: No  . Drug Use: No  . Sexual Activity: Not on file   Other Topics Concern  . Not on file   Social History Narrative  . No narrative on file    Family History  Problem Relation Age of Onset  . Sudden death Neg Hx   . Hypertension Neg Hx   . Hyperlipidemia Neg Hx   . Heart attack Neg Hx   . Diabetes Neg Hx     There were no vitals taken for this visit.  Review of Systems: See HPI above.    Objective:  Physical Exam:  Gen: NAD, comfortable in exam room  Left hand: Mild swelling, no bruising or  other deformity 1st CMC area. TTP 1st CMC.  No 1st dorsal compartment, carpal tunnel, other tenderness. Pain with all motions of CMC.  No other pain. Negative tinels. Negative finkelsteins. NVI distally.    Assessment & Plan:  1. 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  Tylenol, nsaids, glucosamine, topical medications.  Thumb spica brace if needed.  Heat/ice.  F/u prn.  After informed written consent patient was seated in chair in exam room.  Ultrasound used to identify and mark left 1st CMC.  Then area prepped with alcohol swab and injected with 0.5:0.72mL marcaine: depomedrol.  Patient tolerated procedure well without immediate complications.

## 2015-07-22 DIAGNOSIS — F3112 Bipolar disorder, current episode manic without psychotic features, moderate: Secondary | ICD-10-CM | POA: Diagnosis not present

## 2015-07-29 DIAGNOSIS — F3111 Bipolar disorder, current episode manic without psychotic features, mild: Secondary | ICD-10-CM | POA: Diagnosis not present

## 2015-08-20 DIAGNOSIS — F3111 Bipolar disorder, current episode manic without psychotic features, mild: Secondary | ICD-10-CM | POA: Diagnosis not present

## 2015-09-05 DIAGNOSIS — F3111 Bipolar disorder, current episode manic without psychotic features, mild: Secondary | ICD-10-CM | POA: Diagnosis not present

## 2015-09-22 DIAGNOSIS — Z8249 Family history of ischemic heart disease and other diseases of the circulatory system: Secondary | ICD-10-CM | POA: Diagnosis not present

## 2015-09-22 DIAGNOSIS — Z125 Encounter for screening for malignant neoplasm of prostate: Secondary | ICD-10-CM | POA: Diagnosis not present

## 2015-09-22 DIAGNOSIS — F17200 Nicotine dependence, unspecified, uncomplicated: Secondary | ICD-10-CM | POA: Diagnosis not present

## 2015-09-22 DIAGNOSIS — F3111 Bipolar disorder, current episode manic without psychotic features, mild: Secondary | ICD-10-CM | POA: Diagnosis not present

## 2015-09-22 DIAGNOSIS — Z136 Encounter for screening for cardiovascular disorders: Secondary | ICD-10-CM | POA: Diagnosis not present

## 2015-09-25 DIAGNOSIS — F3112 Bipolar disorder, current episode manic without psychotic features, moderate: Secondary | ICD-10-CM | POA: Diagnosis not present

## 2015-12-05 DIAGNOSIS — F17200 Nicotine dependence, unspecified, uncomplicated: Secondary | ICD-10-CM | POA: Diagnosis not present

## 2015-12-05 DIAGNOSIS — Z136 Encounter for screening for cardiovascular disorders: Secondary | ICD-10-CM | POA: Diagnosis not present

## 2015-12-05 DIAGNOSIS — Z125 Encounter for screening for malignant neoplasm of prostate: Secondary | ICD-10-CM | POA: Diagnosis not present

## 2015-12-12 DIAGNOSIS — Z0000 Encounter for general adult medical examination without abnormal findings: Secondary | ICD-10-CM | POA: Diagnosis not present

## 2015-12-12 DIAGNOSIS — Z135 Encounter for screening for eye and ear disorders: Secondary | ICD-10-CM | POA: Diagnosis not present

## 2015-12-12 DIAGNOSIS — E785 Hyperlipidemia, unspecified: Secondary | ICD-10-CM | POA: Diagnosis not present

## 2015-12-12 DIAGNOSIS — F17200 Nicotine dependence, unspecified, uncomplicated: Secondary | ICD-10-CM | POA: Diagnosis not present

## 2015-12-12 DIAGNOSIS — Z23 Encounter for immunization: Secondary | ICD-10-CM | POA: Diagnosis not present

## 2015-12-16 DIAGNOSIS — H6123 Impacted cerumen, bilateral: Secondary | ICD-10-CM | POA: Diagnosis not present

## 2016-01-14 DIAGNOSIS — F17210 Nicotine dependence, cigarettes, uncomplicated: Secondary | ICD-10-CM | POA: Diagnosis not present

## 2016-01-15 DIAGNOSIS — F17200 Nicotine dependence, unspecified, uncomplicated: Secondary | ICD-10-CM | POA: Diagnosis not present

## 2016-01-15 DIAGNOSIS — R918 Other nonspecific abnormal finding of lung field: Secondary | ICD-10-CM | POA: Diagnosis not present

## 2016-02-11 DIAGNOSIS — Z83511 Family history of glaucoma: Secondary | ICD-10-CM | POA: Diagnosis not present

## 2016-02-11 DIAGNOSIS — H524 Presbyopia: Secondary | ICD-10-CM | POA: Diagnosis not present

## 2016-02-11 DIAGNOSIS — H2513 Age-related nuclear cataract, bilateral: Secondary | ICD-10-CM | POA: Diagnosis not present

## 2016-04-07 DIAGNOSIS — K529 Noninfective gastroenteritis and colitis, unspecified: Secondary | ICD-10-CM | POA: Diagnosis not present

## 2016-06-06 DIAGNOSIS — K047 Periapical abscess without sinus: Secondary | ICD-10-CM | POA: Diagnosis not present

## 2016-06-25 ENCOUNTER — Encounter (HOSPITAL_BASED_OUTPATIENT_CLINIC_OR_DEPARTMENT_OTHER): Payer: Self-pay

## 2016-06-25 ENCOUNTER — Emergency Department (HOSPITAL_BASED_OUTPATIENT_CLINIC_OR_DEPARTMENT_OTHER)
Admission: EM | Admit: 2016-06-25 | Discharge: 2016-06-25 | Disposition: A | Discharge status: Home / Self Care | Payer: Medicare Other | Attending: Emergency Medicine | Admitting: Emergency Medicine

## 2016-06-25 DIAGNOSIS — S40861A Insect bite (nonvenomous) of right upper arm, initial encounter: Secondary | ICD-10-CM | POA: Diagnosis not present

## 2016-06-25 DIAGNOSIS — S50861A Insect bite (nonvenomous) of right forearm, initial encounter: Secondary | ICD-10-CM | POA: Diagnosis not present

## 2016-06-25 DIAGNOSIS — W57XXXA Bitten or stung by nonvenomous insect and other nonvenomous arthropods, initial encounter: Secondary | ICD-10-CM | POA: Insufficient documentation

## 2016-06-25 DIAGNOSIS — Y999 Unspecified external cause status: Secondary | ICD-10-CM | POA: Diagnosis not present

## 2016-06-25 DIAGNOSIS — Y92009 Unspecified place in unspecified non-institutional (private) residence as the place of occurrence of the external cause: Secondary | ICD-10-CM | POA: Diagnosis not present

## 2016-06-25 DIAGNOSIS — F17210 Nicotine dependence, cigarettes, uncomplicated: Secondary | ICD-10-CM | POA: Diagnosis not present

## 2016-06-25 DIAGNOSIS — Y9389 Activity, other specified: Secondary | ICD-10-CM | POA: Insufficient documentation

## 2016-06-25 HISTORY — DX: Dorsalgia, unspecified: M54.9

## 2016-06-25 MED ORDER — PERMETHRIN 5 % EX CREA: TOPICAL_CREAM | CUTANEOUS | 1 refills | Status: AC

## 2016-06-25 MED ORDER — CEPHALEXIN 500 MG PO CAPS: 500.0000 mg | ORAL_CAPSULE | Freq: Three times a day (TID) | ORAL | 0 refills | Status: AC

## 2016-06-25 NOTE — ED Notes (Signed)
Patient requesting to sign out AMA.  EDP walking to room to see patient.

## 2016-06-25 NOTE — ED Provider Notes (Signed)
[facility] DEPT Provider Note   CSN: [id number] Arrival date & time: 06/25/16  2009  By signing my name below, I, Janina Mayo and Hansel Feinstein, attest that this documentation has been prepared under the direction and in the presence of Julianne Rice, MD . Electronically Signed: Janina Mayo and Hansel Feinstein, Scribe. 06/25/2016. 9:45 PM.    History   Chief Complaint Chief Complaint  Patient presents with  . Insect Bite    The history is provided by the patient. No language interpreter was used.   HPI Comments:  Brendan Caldwell is a 66 y.o. male who presents to the Emergency Department complaining of scattered, painful red "bumps" from suspected insect bites that began 2 days ago. Per pt, he initially noticed a significant area of redness, pain and swelling to the right forearm before smaller areas of redness and pain began to appear gradually all over his body. Pt reports seeing "spider mites" on his couch and spraying the area with insecticide prior to sleeping on the couch the next night before his symptoms began. No other members of his household have similar symptoms. He was recently on Penicillin for tx of an abscess, but is no longer on any antibiotics. Pt denies fever and chills.   Past Medical History:  Diagnosis Date  . Back pain   . Hyperlipidemia     Patient Active Problem List   Diagnosis Date Noted  . [place] arthritis 04/25/2015  . Chronic low back pain 12/11/2012    Past Surgical History:  Procedure Laterality Date  . FINGER SURGERY         Home Medications    Prior to Admission medications   Medication Sig Start Date End Date Taking? Authorizing Provider  cephALEXin (KEFLEX) 500 MG capsule Take 1 capsule (500 mg total) by mouth 3 (three) times daily. 06/25/16   Julianne Rice, MD  permethrin Nancy Fetter) 5 % cream Apply to affected area once 06/25/16   Julianne Rice, MD    Family History Family History  Problem Relation Age of Onset  .  Sudden death Neg Hx   . Hypertension Neg Hx   . Hyperlipidemia Neg Hx   . Heart attack Neg Hx   . Diabetes Neg Hx     Social History Social History  Substance Use Topics  . Smoking status: Current Every Day Smoker    Types: Cigarettes  . Smokeless tobacco: Never Used  . Alcohol use No     Allergies   Patient has no known allergies.   Review of Systems Review of Systems  Constitutional: Negative for chills and fever.  Respiratory: Negative for cough and shortness of breath.   Gastrointestinal: Negative for abdominal pain, diarrhea, nausea and vomiting.  Musculoskeletal: Negative for back pain, myalgias, neck pain and neck stiffness.  Skin: Positive for color change and rash.  Neurological: Negative for dizziness, weakness, light-headedness, numbness and headaches.  All other systems reviewed and are negative.    Physical Exam Updated Vital Signs BP 131/82 (BP Location: Right Arm)   Pulse 81   Temp 99.1 F (37.3 C) (Oral)   Resp 18   Ht 5\' 8"  (1.727 m)   Wt 70.8 kg (156 lb)   SpO2 99%   BMI 23.72 kg/m   Physical Exam  Constitutional: He is oriented to person, place, and time. He appears well-developed and well-nourished. No distress.  HENT:  Head: Normocephalic and atraumatic.  Mouth/Throat: Oropharynx is clear and moist.  Eyes: EOM are normal. Pupils are equal, round,  and reactive to light.  Neck: Normal range of motion. Neck supple.  Cardiovascular: Normal rate and regular rhythm.   Pulmonary/Chest: Effort normal and breath sounds normal.  Abdominal: Soft. Bowel sounds are normal. There is no tenderness. There is no rebound and no guarding.  Musculoskeletal: Normal range of motion. He exhibits no edema or tenderness.  Neurological: He is alert and oriented to person, place, and time.  Skin: Skin is warm and dry. Capillary refill takes less than 2 seconds. No rash noted. No erythema.  Patient with multiple papules with surrounding erythema. Appears mostly in  clusters of 3 or 4. Patient has a area of induration and erythema to his midforearm that is roughly 2 cm in diameter. Suspect secondary infection. No interdigital rashes.  Psychiatric: He has a normal mood and affect. His behavior is normal.  Nursing note and vitals reviewed.    ED Treatments / Results  DIAGNOSTIC STUDIES:  Oxygen Saturation is 99% on RA, normal by my interpretation.    COORDINATION OF CARE:  9:41 PM Discussed treatment plan with pt at bedside and pt agreed to plan.  Labs (all labs ordered are listed, but only abnormal results are displayed) Labs Reviewed - No data to display  EKG  EKG Interpretation None       Radiology No results found.  Procedures Procedures (including critical care time)  Medications Ordered in ED Medications - No data to display   Initial Impression / Assessment and Plan / ED Course  I have reviewed the triage vital signs and the nursing notes.  Pertinent labs & imaging results that were available during my care of the patient were reviewed by me and considered in my medical decision making (see chart for details).     Patient with likely insect/might bites. The area to the forearm appears to have secondary infection. Will start on antibiotics. Could represent scabies though doubtful. Given Elimite cream. Patient is advised to have the specialists and evaluate home for decontamination.  Final Clinical Impressions(s) / ED Diagnoses   Final diagnoses:  Bug bite with infection, initial encounter    New Prescriptions Discharge Medication List as of 06/25/2016  9:45 PM    START taking these medications   Details  cephALEXin (KEFLEX) 500 MG capsule Take 1 capsule (500 mg total) by mouth 3 (three) times daily., Starting Fri 06/25/2016, Print    permethrin (ELIMITE) 5 % cream Apply to affected area once, Print      I personally performed the services described in this documentation, which was scribed in my presence. The  recorded information has been reviewed and is accurate.       Julianne Rice, MD 06/28/16 [phone]

## 2016-06-25 NOTE — ED Notes (Signed)
Patient reports abscess x 3 days to right forearm as well as rash to back and arms.  Reports that he was taking penicillin for an infected tooth and finished the dose 3 days ago.

## 2016-06-25 NOTE — ED Triage Notes (Signed)
C/o ? "spider mite" bite to right FA-"red spots on my back" x 2-3 days-NAD-steady gait

## 2017-11-16 ENCOUNTER — Emergency Department (HOSPITAL_BASED_OUTPATIENT_CLINIC_OR_DEPARTMENT_OTHER)
Admission: EM | Admit: 2017-11-16 | Discharge: 2017-11-16 | Disposition: A | Discharge status: Home / Self Care | Payer: Medicare Other | Attending: Emergency Medicine | Admitting: Emergency Medicine

## 2017-11-16 ENCOUNTER — Encounter (HOSPITAL_BASED_OUTPATIENT_CLINIC_OR_DEPARTMENT_OTHER): Payer: Self-pay

## 2017-11-16 ENCOUNTER — Other Ambulatory Visit: Payer: Self-pay

## 2017-11-16 DIAGNOSIS — K047 Periapical abscess without sinus: Secondary | ICD-10-CM | POA: Insufficient documentation

## 2017-11-16 DIAGNOSIS — F17210 Nicotine dependence, cigarettes, uncomplicated: Secondary | ICD-10-CM | POA: Diagnosis not present

## 2017-11-16 DIAGNOSIS — K0889 Other specified disorders of teeth and supporting structures: Secondary | ICD-10-CM | POA: Diagnosis present

## 2017-11-16 MED ORDER — CLINDAMYCIN HCL 150 MG PO CAPS: 300.0000 mg | ORAL_CAPSULE | Freq: Three times a day (TID) | ORAL | 0 refills | Status: AC

## 2017-11-16 NOTE — ED Notes (Signed)
Pt verbalizes understanding of d/c instructions and denies any further needs at this time. 

## 2017-11-16 NOTE — ED Triage Notes (Signed)
Pt states he lost a dental bridge right upper yesterday-c/o pain-NAD-steady gait

## 2017-11-16 NOTE — ED Provider Notes (Signed)
[facility] Provider Note   CSN: [id number] Arrival date & time: 11/16/17  2116     History   Chief Complaint Chief Complaint  Patient presents with  . Dental Pain    HPI Brendan Caldwell is a 67 y.o. male.  The history is provided by the patient.  Dental Pain   This is a new problem. The current episode started 2 days ago. The problem occurs constantly. The problem has not changed since onset.The pain is at a severity of 3/10. The pain is mild. He has tried acetaminophen for the symptoms. The treatment provided mild relief.    Past Medical History:  Diagnosis Date  . Back pain   . Hyperlipidemia     Patient Active Problem List   Diagnosis Date Noted  . 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 Chronic low back pain 12/11/2012    Past Surgical History:  Procedure Laterality Date  . FINGER SURGERY          Home Medications    Prior to Admission medications   Medication Sig Start Date End Date Taking? Authorizing Provider  cephALEXin (KEFLEX) 500 MG capsule Take 1 capsule (500 mg total) by mouth 3 (three) times daily. 06/25/16   Julianne Rice, MD  clindamycin (CLEOCIN) 150 MG capsule Take 2 capsules (300 mg total) by mouth 3 (three) times daily for 7 days. 11/16/17 11/23/17  Lennice Sites, DO  permethrin (ELIMITE) 5 % cream Apply to affected area once 06/25/16   Julianne Rice, MD    Family History Family History  Problem Relation Age of Onset  . Sudden death Neg Hx   . Hypertension Neg Hx   . Hyperlipidemia Neg Hx   . Heart attack Neg Hx   . Diabetes Neg Hx     Social History Social History   Tobacco Use  . Smoking status: Current Every Day Smoker    Types: Cigarettes  . Smokeless tobacco: Never Used  Substance Use Topics  . Alcohol use: No  . Drug use: No     Allergies   Patient has no known allergies.   Review of Systems Review of Systems  Constitutional: Negative for chills and fever.  HENT: Positive for dental  problem. Negative for ear pain and sore throat.   Eyes: Negative for pain and visual disturbance.  Respiratory: Negative for cough and shortness of breath.   Cardiovascular: Negative for chest pain and palpitations.  Gastrointestinal: Negative for abdominal pain and vomiting.  Genitourinary: Negative for dysuria and hematuria.  Musculoskeletal: Negative for arthralgias and back pain.  Skin: Negative for color change and rash.  Neurological: Negative for seizures and syncope.  All other systems reviewed and are negative.    Physical Exam Updated Vital Signs  ED Triage Vitals [11/16/17 2133]  Enc Vitals Group     BP (!) 128/59     Pulse Rate 77     Resp 20     Temp 97.9 F (36.6 C)     Temp Source Oral     SpO2 100 %     Weight      Height      Head Circumference      Peak Flow      Pain Score 10     Pain Loc      Pain Edu?      Excl. in [place]?     Physical Exam  Constitutional: He appears well-developed and well-nourished.  HENT:  Head: Normocephalic and atraumatic.  Mouth/Throat:  No oropharyngeal exudate.  No obvious infection on oral exam, has some dental caries, no obvious tenderness on palpation of his teeth  Eyes: Pupils are equal, round, and reactive to light. Conjunctivae and EOM are normal.  Neck: Neck supple.  Cardiovascular: Normal rate, regular rhythm, normal heart sounds and intact distal pulses.  No murmur heard. Pulmonary/Chest: Effort normal and breath sounds normal. No respiratory distress.  Abdominal: Soft. There is no tenderness.  Musculoskeletal: He exhibits no edema.  Neurological: He is alert.  Skin: Skin is warm and dry.  Psychiatric: He has a normal mood and affect.  Nursing note and vitals reviewed.    ED Treatments / Results  Labs (all labs ordered are listed, but only abnormal results are displayed) Labs Reviewed - No data to display  EKG None  Radiology No results found.  Procedures Procedures (including critical care  time)  Medications Ordered in ED Medications - No data to display   Initial Impression / Assessment and Plan / ED Course  I have reviewed the triage vital signs and the nursing notes.  Pertinent labs & imaging results that were available during my care of the patient were reviewed by me and considered in my medical decision making (see chart for details).     Brendan Caldwell is a 67 year old male with no significant medical history presents to the ED with dental pain.  Patient with normal vitals.  No fever.  Patient with pain in the right upper teeth.  Has an appointment with oral surgery next week.  Has had a history of dental infections in the past, poor dentition on exam.  Has taken Tylenol Motrin with some relief.  Patient with no obvious signs of infection on exam.  No obvious tenderness to palpation of his gums.  Will treat however with clindamycin given poor health of teeth.  Recommend follow-up with dentistry which he has in place.  Told to return to ED if symptoms worsen.  Final Clinical Impressions(s) / ED Diagnoses   Final diagnoses:  Toothache  Dental infection    ED Discharge Orders         Ordered    clindamycin (CLEOCIN) 150 MG capsule  3 times daily     11/16/17 2229           Lennice Sites, DO 11/17/17 0100

## 2018-06-11 ENCOUNTER — Emergency Department (HOSPITAL_BASED_OUTPATIENT_CLINIC_OR_DEPARTMENT_OTHER)
Admission: EM | Admit: 2018-06-11 | Discharge: 2018-06-11 | Disposition: A | Discharge status: Home / Self Care | Payer: Medicare Other | Attending: Emergency Medicine | Admitting: Emergency Medicine

## 2018-06-11 ENCOUNTER — Other Ambulatory Visit: Payer: Self-pay

## 2018-06-11 ENCOUNTER — Encounter (HOSPITAL_BASED_OUTPATIENT_CLINIC_OR_DEPARTMENT_OTHER): Payer: Self-pay | Admitting: *Deleted

## 2018-06-11 DIAGNOSIS — K0889 Other specified disorders of teeth and supporting structures: Secondary | ICD-10-CM | POA: Diagnosis not present

## 2018-06-11 DIAGNOSIS — F17210 Nicotine dependence, cigarettes, uncomplicated: Secondary | ICD-10-CM | POA: Insufficient documentation

## 2018-06-11 MED ORDER — HYDROCODONE-ACETAMINOPHEN 5-325 MG PO TABS: 1.0000 | ORAL_TABLET | Freq: Four times a day (QID) | ORAL | 0 refills | Status: AC | PRN

## 2018-06-11 MED ORDER — CLINDAMYCIN HCL 300 MG PO CAPS: 300.0000 mg | ORAL_CAPSULE | Freq: Four times a day (QID) | ORAL | 0 refills | Status: AC

## 2018-06-11 NOTE — ED Provider Notes (Signed)
[facility] Provider Note   CSN: [id number] Arrival date & time: 06/11/18  2006    History   Chief Complaint Chief Complaint  Patient presents with  . Dental Pain    HPI Brendan Caldwell is a 68 y.o. male.     Patient is a 68 year old male presenting with complaints of dental pain.  Patient has pain in the left upper molars.  He has a bridge in place that covers several teeth.  He is having discomfort underneath of this.  He had a similar episode in October on the other side of his mouth and was treated successfully with clindamycin.  He denies any fevers or chills.  He denies any difficulty breathing or swallowing.  The history is provided by the patient.  Dental Pain  Location:  Upper Upper teeth location:  14/LU 1st molar, 15/LU 2nd molar and 16/LU 3rd molar Quality:  Constant and throbbing Severity:  Severe Onset quality:  Sudden Duration:  1 day Timing:  Constant Progression:  Worsening Chronicity:  New Relieved by:  Nothing Worsened by:  Nothing Ineffective treatments:  None tried   Past Medical History:  Diagnosis Date  . Back pain   . Hyperlipidemia     Patient Active Problem List   Diagnosis Date Noted  . [place] arthritis 04/25/2015  . Chronic low back pain 12/11/2012    Past Surgical History:  Procedure Laterality Date  . FINGER SURGERY          Home Medications    Prior to Admission medications   Medication Sig Start Date End Date Taking? Authorizing Provider  cephALEXin (KEFLEX) 500 MG capsule Take 1 capsule (500 mg total) by mouth 3 (three) times daily. 06/25/16   Julianne Rice, MD  permethrin Nancy Fetter) 5 % cream Apply to affected area once 06/25/16   Julianne Rice, MD    Family History Family History  Problem Relation Age of Onset  . Sudden death Neg Hx   . Hypertension Neg Hx   . Hyperlipidemia Neg Hx   . Heart attack Neg Hx   . Diabetes Neg Hx     Social History Social History   Tobacco Use   . Smoking status: Current Every Day Smoker    Types: Cigarettes  . Smokeless tobacco: Never Used  Substance Use Topics  . Alcohol use: No  . Drug use: No     Allergies   Patient has no known allergies.   Review of Systems Review of Systems  All other systems reviewed and are negative.    Physical Exam Updated Vital Signs BP (!) 165/79 (BP Location: Left Arm)   Pulse 87   Temp 98.1 F (36.7 C) (Oral)   Resp 18   Ht 5\' 8"  (1.727 m)   Wt 70.8 kg   SpO2 100%   BMI 23.72 kg/m   Physical Exam Vitals signs and nursing note reviewed.  Constitutional:      General: He is not in acute distress.    Appearance: Normal appearance. He is not ill-appearing.  HENT:     Head: Normocephalic and atraumatic.     Mouth/Throat:     Mouth: Mucous membranes are moist.     Pharynx: No oropharyngeal exudate.     Comments: There is a bridge in place to the left upper molars.  There is some surrounding gingival inflammation, however no definite abscess.  There is no crepitus or swelling of the submental space. Neurological:     Mental Status: He  is alert.      ED Treatments / Results  Labs (all labs ordered are listed, but only abnormal results are displayed) Labs Reviewed - No data to display  EKG None  Radiology No results found.  Procedures Procedures (including critical care time)  Medications Ordered in ED Medications - No data to display   Initial Impression / Assessment and Plan / ED Course  I have reviewed the triage vital signs and the nursing notes.  Pertinent labs & imaging results that were available during my care of the patient were reviewed by me and considered in my medical decision making (see chart for details).  Patient will be treated with clindamycin and pain medication.  He is to follow-up with his dentist in the next few days.  Final Clinical Impressions(s) / ED Diagnoses   Final diagnoses:  None    ED Discharge Orders    None        Veryl Speak, MD 06/11/18 2037

## 2018-06-11 NOTE — Discharge Instructions (Signed)
Clindamycin as prescribed.  Hydrocodone as prescribed as needed for pain.  Follow-up with your dentist sometime this week, and return to the ER if you develop difficulty breathing or swallowing.

## 2018-06-11 NOTE — ED Triage Notes (Signed)
Pt reports left upper toothache x 1 day

## 2018-07-09 ENCOUNTER — Emergency Department (HOSPITAL_BASED_OUTPATIENT_CLINIC_OR_DEPARTMENT_OTHER)
Admission: EM | Admit: 2018-07-09 | Discharge: 2018-07-09 | Disposition: A | Discharge status: Home / Self Care | Payer: Medicare Other | Attending: Emergency Medicine | Admitting: Emergency Medicine

## 2018-07-09 ENCOUNTER — Emergency Department (HOSPITAL_BASED_OUTPATIENT_CLINIC_OR_DEPARTMENT_OTHER): Payer: Medicare Other

## 2018-07-09 ENCOUNTER — Other Ambulatory Visit: Payer: Self-pay

## 2018-07-09 ENCOUNTER — Encounter (HOSPITAL_BASED_OUTPATIENT_CLINIC_OR_DEPARTMENT_OTHER): Payer: Self-pay | Admitting: *Deleted

## 2018-07-09 DIAGNOSIS — J069 Acute upper respiratory infection, unspecified: Secondary | ICD-10-CM | POA: Diagnosis not present

## 2018-07-09 DIAGNOSIS — F17210 Nicotine dependence, cigarettes, uncomplicated: Secondary | ICD-10-CM | POA: Diagnosis not present

## 2018-07-09 DIAGNOSIS — Z20828 Contact with and (suspected) exposure to other viral communicable diseases: Secondary | ICD-10-CM | POA: Insufficient documentation

## 2018-07-09 DIAGNOSIS — R05 Cough: Secondary | ICD-10-CM | POA: Diagnosis present

## 2018-07-09 LAB — GROUP A STREP BY PCR: Group A Strep by PCR: NOT DETECTED

## 2018-07-09 MED ORDER — BENZONATATE 100 MG PO CAPS: 100.0000 mg | ORAL_CAPSULE | Freq: Three times a day (TID) | ORAL | 0 refills | Status: AC | PRN

## 2018-07-09 NOTE — ED Provider Notes (Signed)
[name] Provider Note   CSN: [id number] Arrival date & time: 07/09/18  2033    History   Chief Complaint Chief Complaint  Patient presents with  . Cough    HPI Brendan Caldwell is a 68 y.o. male.     HPI Patient states he works in a [employer] has had several cases of [employer] positive employees.  States he developed dry nonproductive cough, sore throat, lymphadenopathy and diffuse myalgias starting this afternoon.  Denies fever or chills.  No chest pain or shortness of breath.  No new lower extremity swelling or pain. Past Medical History:  Diagnosis Date  . Back pain   . Hyperlipidemia     Patient Active Problem List   Diagnosis Date Noted  . [place] arthritis 04/25/2015  . Chronic low back pain 12/11/2012    Past Surgical History:  Procedure Laterality Date  . FINGER SURGERY          Home Medications    Prior to Admission medications   Medication Sig Start Date End Date Taking? Authorizing Provider  benzonatate (TESSALON) 100 MG capsule Take 1 capsule (100 mg total) by mouth 3 (three) times daily as needed for cough. 07/09/18   Julianne Rice, MD  cephALEXin (KEFLEX) 500 MG capsule Take 1 capsule (500 mg total) by mouth 3 (three) times daily. 06/25/16   Julianne Rice, MD  clindamycin (CLEOCIN) 300 MG capsule Take 1 capsule (300 mg total) by mouth 4 (four) times daily. X 7 days 06/11/18   Veryl Speak, MD  HYDROcodone-acetaminophen (NORCO) 5-325 MG tablet Take 1-2 tablets by mouth every 6 (six) hours as needed. 06/11/18   Veryl Speak, MD  permethrin Nancy Fetter) 5 % cream Apply to affected area once 06/25/16   Julianne Rice, MD    Family History Family History  Problem Relation Age of Onset  . Sudden death Neg Hx   . Hypertension Neg Hx   . Hyperlipidemia Neg Hx   . Heart attack Neg Hx   . Diabetes Neg Hx     Social History Social History   Tobacco Use  . Smoking status: Current Every Day Smoker    Types: Cigarettes  .  Smokeless tobacco: Never Used  Substance Use Topics  . Alcohol use: No  . Drug use: No     Allergies   Patient has no known allergies.   Review of Systems Review of Systems  Constitutional: Negative for chills and fever.  HENT: Positive for sore throat. Negative for sinus pressure, trouble swallowing and voice change.   Eyes: Negative for visual disturbance.  Respiratory: Positive for cough. Negative for shortness of breath.   Cardiovascular: Negative for chest pain.  Gastrointestinal: Negative for abdominal pain, constipation, diarrhea, nausea and vomiting.  Genitourinary: Negative for dysuria, flank pain and frequency.  Musculoskeletal: Positive for arthralgias and myalgias. Negative for back pain, neck pain and neck stiffness.  Skin: Negative for rash and wound.  Neurological: Negative for weakness, numbness and headaches.  All other systems reviewed and are negative.    Physical Exam Updated Vital Signs BP (!) 124/58 (BP Location: Right Arm)   Pulse 80   Temp 98.4 F (36.9 C) (Oral)   Resp 18   Ht 5\' 8"  (1.727 m)   Wt 71.2 kg   SpO2 99%   BMI 23.87 kg/m   Physical Exam Vitals signs and nursing note reviewed.  Constitutional:      General: He is not in acute distress.    Appearance: Normal  appearance. He is well-developed. He is not ill-appearing.  HENT:     Head: Normocephalic and atraumatic.     Nose: Nose normal. No congestion.     Mouth/Throat:     Mouth: Mucous membranes are moist.     Pharynx: Posterior oropharyngeal erythema present. No oropharyngeal exudate.     Comments: Erythematous oropharynx with few vesicles on the soft palate.  No tonsillar exudates. Eyes:     Extraocular Movements: Extraocular movements intact.     Pupils: Pupils are equal, round, and reactive to light.  Neck:     Musculoskeletal: Normal range of motion and neck supple. No neck rigidity or muscular tenderness.     Vascular: No carotid bruit.     Comments: No meningismus  Cardiovascular:     Rate and Rhythm: Normal rate and regular rhythm.     Heart sounds: No murmur. No friction rub. No gallop.   Pulmonary:     Effort: Pulmonary effort is normal. No respiratory distress.     Breath sounds: Normal breath sounds. No stridor. No wheezing, rhonchi or rales.  Chest:     Chest wall: No tenderness.  Abdominal:     General: Bowel sounds are normal.     Palpations: Abdomen is soft.     Tenderness: There is no abdominal tenderness. There is no guarding or rebound.  Musculoskeletal: Normal range of motion.        General: No swelling, tenderness, deformity or signs of injury.     Right lower leg: No edema.     Left lower leg: No edema.  Lymphadenopathy:     Cervical: Cervical adenopathy present.  Skin:    General: Skin is warm and dry.     Capillary Refill: Capillary refill takes less than 2 seconds.     Findings: No erythema or rash.  Neurological:     General: No focal deficit present.     Mental Status: He is alert and oriented to person, place, and time.  Psychiatric:        Mood and Affect: Mood normal.        Behavior: Behavior normal.      ED Treatments / Results  Labs (all labs ordered are listed, but only abnormal results are displayed) Labs Reviewed  GROUP A STREP BY PCR  NOVEL CORONAVIRUS, NAA (HOSPITAL ORDER, SEND-OUT TO REF LAB)    EKG None  Radiology Dg Chest 2 View  Result Date: 07/09/2018 CLINICAL DATA:  Sore throat with cough and joint pain since this afternoon. Co-worker tested positive for COVID-19 last week. EXAM: CHEST - 2 VIEW COMPARISON:  03/25/2013 FINDINGS: Lungs are adequately inflated without focal airspace consolidation or effusion. Cardiomediastinal silhouette is within normal. There mild degenerate changes of the spine. IMPRESSION: No active cardiopulmonary disease. Electronically Signed   By: Marin Olp M.D.   On: 07/09/2018 21:24    Procedures Procedures (including critical care time)  Medications Ordered in  ED Medications - No data to display   Initial Impression / Assessment and Plan / ED Course  I have reviewed the triage vital signs and the nursing notes.  Pertinent labs & imaging results that were available during my care of the patient were reviewed by me and considered in my medical decision making (see chart for details).        Chest x-ray with no acute findings.  Strep testing is negative.  Send out COVID testing.  Suspect viral pharyngitis/URI.  COVID quarantine precautions given until testing has  resulted.  Final Clinical Impressions(s) / ED Diagnoses   Final diagnoses:  Viral URI with cough    ED Discharge Orders         Ordered    benzonatate (TESSALON) 100 MG capsule  3 times daily PRN     07/09/18 2141           Julianne Rice, MD 07/09/18 2142

## 2018-07-09 NOTE — ED Triage Notes (Signed)
Pt reports sore throat, cough, joint pain, and swollen glands since 2pm today. 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

## 2018-07-11 LAB — NOVEL CORONAVIRUS, NAA (HOSP ORDER, SEND-OUT TO REF LAB; TAT 18-24 HRS): SARS-CoV-2, NAA: NOT DETECTED

## 2019-09-19 ENCOUNTER — Other Ambulatory Visit (HOSPITAL_BASED_OUTPATIENT_CLINIC_OR_DEPARTMENT_OTHER): Payer: Self-pay | Admitting: Internal Medicine

## 2019-11-16 ENCOUNTER — Other Ambulatory Visit (HOSPITAL_BASED_OUTPATIENT_CLINIC_OR_DEPARTMENT_OTHER): Payer: Self-pay

## 2020-01-15 ENCOUNTER — Ambulatory Visit: Payer: Medicare Other | Attending: Internal Medicine

## 2020-01-15 ENCOUNTER — Other Ambulatory Visit (HOSPITAL_BASED_OUTPATIENT_CLINIC_OR_DEPARTMENT_OTHER): Payer: Self-pay | Admitting: Internal Medicine

## 2020-01-15 DIAGNOSIS — Z23 Encounter for immunization: Secondary | ICD-10-CM

## 2020-01-15 NOTE — Progress Notes (Signed)
   Covid-19 Vaccination Clinic  Name:  Brendan Caldwell    MRN: [id number] DOB: [birth year]  01/15/2020  Mr. Brendan Caldwell was observed post Covid-19 immunization for 15 minutes without incident. He was provided with Vaccine Information Sheet and instruction to access the V-Safe system.   Mr. Brendan Caldwell was instructed to call 911 with any severe reactions post vaccine: Marland Kitchen Difficulty breathing  . Swelling of face and throat  . A fast heartbeat  . A bad rash all over body  . Dizziness and weakness   Immunizations Administered    No immunizations on file.

## 2020-03-12 ENCOUNTER — Other Ambulatory Visit (HOSPITAL_BASED_OUTPATIENT_CLINIC_OR_DEPARTMENT_OTHER): Payer: Self-pay | Admitting: Neurology

## 2020-05-22 ENCOUNTER — Other Ambulatory Visit (HOSPITAL_BASED_OUTPATIENT_CLINIC_OR_DEPARTMENT_OTHER): Payer: Self-pay

## 2020-05-22 MED FILL — Atorvastatin Calcium Tab 20 MG (Base Equivalent): ORAL | 90 days supply | Qty: 90 | Fill #0 | Status: AC

## 2020-05-22 MED FILL — Lorazepam Tab 1 MG: ORAL | 1 days supply | Qty: 2 | Fill #0 | Status: AC

## 2020-08-18 ENCOUNTER — Ambulatory Visit: Payer: Medicare Other | Attending: Internal Medicine

## 2020-08-18 ENCOUNTER — Other Ambulatory Visit (HOSPITAL_BASED_OUTPATIENT_CLINIC_OR_DEPARTMENT_OTHER): Payer: Self-pay

## 2020-08-18 DIAGNOSIS — Z23 Encounter for immunization: Secondary | ICD-10-CM

## 2020-08-18 MED FILL — Atorvastatin Calcium Tab 20 MG (Base Equivalent): ORAL | 90 days supply | Qty: 90 | Fill #1 | Status: AC

## 2020-08-18 NOTE — Progress Notes (Signed)
   Covid-19 Vaccination Clinic  Name:  Aspen Deterding    MRN: [id number] DOB: Jul 25, 1950  08/18/2020  Mr. Jayson was observed post Covid-19 immunization for 15 minutes without incident. He was provided with Vaccine Information Sheet and instruction to access the V-Safe system.   Mr. Liddy was instructed to call 911 with any severe reactions post vaccine: Difficulty breathing  Swelling of face and throat  A fast heartbeat  A bad rash all over body  Dizziness and weakness   Immunizations Administered     Name Date Dose VIS Date Route   Moderna Covid-19 Booster Vaccine 08/18/2020 12:46 PM 0.25 mL 11/21/2019 Intramuscular   Manufacturer: Moderna   Lot: [id number]   [place]: 90383-338-32

## 2020-08-22 ENCOUNTER — Other Ambulatory Visit (HOSPITAL_BASED_OUTPATIENT_CLINIC_OR_DEPARTMENT_OTHER): Payer: Self-pay

## 2020-08-22 MED ORDER — COVID-19 MRNA VACC (MODERNA) 100 MCG/0.5ML IM SUSP
INTRAMUSCULAR | 0 refills | Status: DC
  Filled 2020-08-22: qty 0.25, 1d supply, fill #0

## 2020-11-18 ENCOUNTER — Other Ambulatory Visit (HOSPITAL_BASED_OUTPATIENT_CLINIC_OR_DEPARTMENT_OTHER): Payer: Self-pay

## 2020-11-18 ENCOUNTER — Other Ambulatory Visit (HOSPITAL_BASED_OUTPATIENT_CLINIC_OR_DEPARTMENT_OTHER): Payer: Self-pay | Admitting: Internal Medicine

## 2020-11-22 ENCOUNTER — Emergency Department (HOSPITAL_BASED_OUTPATIENT_CLINIC_OR_DEPARTMENT_OTHER): Payer: Medicare Other

## 2020-11-22 ENCOUNTER — Encounter (HOSPITAL_BASED_OUTPATIENT_CLINIC_OR_DEPARTMENT_OTHER): Payer: Self-pay | Admitting: Emergency Medicine

## 2020-11-22 ENCOUNTER — Emergency Department (HOSPITAL_BASED_OUTPATIENT_CLINIC_OR_DEPARTMENT_OTHER)
Admission: EM | Admit: 2020-11-22 | Discharge: 2020-11-22 | Disposition: A | Discharge status: Home / Self Care | Payer: Medicare Other | Attending: Emergency Medicine | Admitting: Emergency Medicine

## 2020-11-22 ENCOUNTER — Other Ambulatory Visit: Payer: Self-pay

## 2020-11-22 DIAGNOSIS — K5900 Constipation, unspecified: Secondary | ICD-10-CM | POA: Diagnosis present

## 2020-11-22 DIAGNOSIS — K5903 Drug induced constipation: Secondary | ICD-10-CM | POA: Diagnosis not present

## 2020-11-22 DIAGNOSIS — F17210 Nicotine dependence, cigarettes, uncomplicated: Secondary | ICD-10-CM | POA: Diagnosis not present

## 2020-11-22 HISTORY — DX: Unilateral inguinal hernia, without obstruction or gangrene, not specified as recurrent: K40.90

## 2020-11-22 LAB — OCCULT BLOOD X 1 CARD TO LAB, STOOL: Fecal Occult Bld: NEGATIVE

## 2020-11-22 MED ORDER — SORBITOL 70 % SOLN: 960.0000 mL | TOPICAL_OIL | Freq: Once | ORAL | Status: DC

## 2020-11-22 NOTE — ED Triage Notes (Addendum)
Pt c/o had double hernia surgery Wednesday and now experiencing constipation. Pt has tried Miralax without relief. Last bowel movement was Tuesday.

## 2020-11-22 NOTE — Discharge Instructions (Addendum)
Please drink plenty of water, start taking MiraLAX 1 cap in beverage of your choice per each meal and once at bedtime continue to hydrate well.  Continue to walk as you have been.  Narcotic pain medicine is into Mr. Making constipation worse.  If you are able to cut down on how much you are taking this would be helpful.  GETTING TO GOOD BOWEL HEALTH. Irregular bowel habits such as constipation and diarrhea can lead to many problems over time.  Having one soft bowel movement a day is the most important way to prevent further problems.  The anorectal canal is designed to handle stretching and feces to safely manage our ability to get rid of solid waste (feces, poop, stool) out of our body.  BUT, hard constipated stools can act like ripping concrete bricks and diarrhea can be a burning fire to this very sensitive area of our body, causing inflamed hemorrhoids, anal fissures, increasing risk is perirectal abscesses, abdominal pain/bloating, an making irritable bowel worse.     The goal: ONE SOFT BOWEL MOVEMENT A DAY!  To have soft, regular bowel movements:  Drink at least 8 tall glasses of water a day.   Take plenty of fiber.  Fiber is the undigested part of plant food that passes into the colon, acting s "natures broom" to encourage bowel motility and movement.  Fiber can absorb and hold large amounts of water. This results in a larger, bulkier stool, which is soft and easier to pass. Work gradually over several weeks up to 6 servings a day of fiber (25g a day even more if needed) in the form of: Vegetables -- Root (potatoes, carrots, turnips), leafy green (lettuce, salad greens, celery, spinach), or cooked high residue (cabbage, broccoli, etc) Fruit -- Fresh (unpeeled skin & pulp), Dried (prunes, apricots, cherries, etc ),  or stewed ( applesauce)  Whole grain breads, pasta, etc (whole wheat)  Bran cereals  Bulking Agents -- This type of water-retaining fiber generally is easily obtained each day by one  of the following:  Psyllium bran -- The psyllium plant is remarkable because its ground seeds can retain so much water. This product is available as Metamucil, Konsyl, Effersyllium, Per Diem Fiber, or the less expensive generic preparation in drug and health food stores. Although labeled a laxative, it really is not a laxative.  Methylcellulose -- This is another fiber derived from wood which also retains water. It is available as Citrucel. Polyethylene Glycol - and "artificial" fiber commonly called Miralax or Glycolax.  It is helpful for people with gassy or bloated feelings with regular fiber Flax Seed - a less gassy fiber than psyllium No reading or other relaxing activity while on the toilet. If bowel movements take longer than 5 minutes, you are too constipated AVOID CONSTIPATION.  High fiber and water intake usually takes care of this.  Sometimes a laxative is needed to stimulate more frequent bowel movements, but  Laxatives are not a good long-term solution as it can wear the colon out. Osmotics (Milk of Magnesia, Fleets phosphosoda, Magnesium citrate, MiraLax, GoLytely) are safer than  Stimulants (Senokot, Castor Oil, Dulcolax, Ex Lax)    Do not take laxatives for more than 7days in a row.  IF SEVERELY CONSTIPATED, try a Bowel Retraining Program: Do not use laxatives.  Eat a diet high in roughage, such as bran cereals and leafy vegetables.  Drink six (6) ounces of prune or apricot juice each morning.  Eat two (2) large servings of stewed fruit  each day.  Take one (1) heaping tablespoon of a psyllium-based bulking agent twice a day. Use sugar-free sweetener when possible to avoid excessive calories.  Eat a normal breakfast.  Set aside 15 minutes after breakfast to sit on the toilet, but do not strain to have a bowel movement.  If you do not have a bowel movement by the third day, use an enema and repeat the above steps.  Controlling diarrhea Switch to liquids and simpler foods for a few  days to avoid stressing your intestines further. Avoid dairy products (especially milk & ice cream) for a short time.  The intestines often can lose the ability to digest lactose when stressed. Avoid foods that cause gassiness or bloating.  Typical foods include beans and other legumes, cabbage, broccoli, and dairy foods.  Every person has some sensitivity to other foods, so listen to our body and avoid those foods that trigger problems for you. Adding fiber (Citrucel, Metamucil, psyllium, Miralax) gradually can help thicken stools by absorbing excess fluid and retrain the intestines to act more normally.  Slowly increase the dose over a few weeks.  Too much fiber too soon can backfire and cause cramping & bloating. Probiotics (such as active yogurt, Align, etc) may help repopulate the intestines and colon with normal bacteria and calm down a sensitive digestive tract.  Most studies show it to be of mild help, though, and such products can be costly. Medicines: Bismuth subsalicylate (ex. Kayopectate, Pepto Bismol) every 30 minutes for up to 6 doses can help control diarrhea.  Avoid if pregnant. Loperamide (Immodium) can slow down diarrhea.  Start with two tablets (4mg  total) first and then try one tablet every 6 hours.  Avoid if you are having fevers or severe pain.  If you are not better or start feeling worse, stop all medicines and call your doctor for advice Call your doctor if you are getting worse or not better.  Sometimes further testing (cultures, endoscopy, X-ray studies, bloodwork, etc) may be needed to help diagnose and treat the cause of the diarrhea.  Managing Pain  Pain after surgery or related to activity is often due to strain/injury to muscle, tendon, nerves and/or incisions.  This pain is usually short-term and will improve in a few months.   Many people find it helpful to do the following things TOGETHER to help speed the process of healing and to get back to regular activity more  quickly:  Avoid heavy physical activity  no lifting greater than 20 pounds Do not "push through" the pain.  Listen to your body and avoid positions and maneuvers than reproduce the pain Walking is okay as tolerated, but go slowly and stop when getting sore.  Remember: If it hurts to do it, then don't do it! Take Anti-inflammatory medication  Take with food/snack around the clock for 1-2 weeks This helps the muscle and nerve tissues become less irritable and calm down faster Choose ONE of the following over-the-counter medications: Naproxen 220mg  tabs (ex. Aleve) 1-2 pills twice a day  Ibuprofen 200mg  tabs (ex. Advil, Motrin) 3-4 pills with every meal and just before bedtime Acetaminophen 500mg  tabs (Tylenol) 1-2 pills with every meal and just before bedtime Use a Heating pad or Ice/Cold Pack 4-6 times a day May use warm bath/hottub  or showers Try Gentle Massage and/or Stretching  at the area of pain many times a day stop if you feel pain - do not overdo it  Try these steps together to help you  body heal faster and avoid making things get worse.  Doing just one of these things may not be enough.    If you are not getting better after two weeks or are noticing you are getting worse, contact our office for further advice; we may need to re-evaluate you & see what other things we can do to help.

## 2020-11-22 NOTE — ED Provider Notes (Signed)
[facility] Provider Note   CSN: [id number] Arrival date & time: 11/22/20  1424     History Chief Complaint  Patient presents with   Constipation    Brendan Caldwell is a 70 y.o. male.  HPI Patient is a 71 year old male with past medical history significant for bilateral inguinal hernia repair 3 days ago.  States that surgery went well he has had no complications however he states that he has had issues with constipation since the surgery states that he has had hard stool.  He states he does not have any pain except for when he is trying to force the stool out of his anus.  He denies any abdominal pain pelvic pain states he has been urinating without difficulty denies any fevers chills cough congestion no nausea or vomiting.  He states he feels quite well in fact has been walking daily as a Psychologist, sport and exercise as instructed.  Patient is still passing gas.  Denies any pain.     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 Authorizing Provider  atorvastatin (LIPITOR) 20 MG tablet TAKE 1 TABLET BY MOUTH ONCE DAILY 09/19/19 11/18/20  Kristopher Glee., MD  benzonatate (TESSALON) 100 MG capsule Take 1 capsule (100 mg total) by mouth 3  (three) times daily as needed for cough. 07/09/18   Julianne Rice, MD  cephALEXin (KEFLEX) 500 MG capsule Take 1 capsule (500 mg total) by mouth 3 (three) times daily. 06/25/16   Julianne Rice, MD  clindamycin (CLEOCIN) 300 MG capsule Take 1 capsule (300 mg total) by mouth 4 (four) times daily. X 7 days 06/11/18   Veryl Speak, MD  COVID-19 mRNA vaccine, Moderna, 100 MCG/0.5ML injection INJECT AS DIRECTED 01/15/20 01/14/21  Carlyle Basques, MD  COVID-19 mRNA vaccine, Moderna, 100 MCG/0.5ML injection Inject into the muscle. 08/18/20   Carlyle Basques, MD  HYDROcodone-acetaminophen (NORCO) 5-325 MG tablet Take 1-2 tablets by mouth every 6 (six) hours as needed. 06/11/18   Veryl Speak, MD  permethrin Nancy Fetter) 5 % cream Apply to affected area once 06/25/16   Julianne Rice, MD    Allergies    Patient has no known allergies.  Review of Systems   Review of Systems  Constitutional:  Negative for chills and fever.  HENT:  Negative for congestion.   Eyes:  Negative for pain.  Respiratory:  Negative for cough and shortness of breath.   Cardiovascular:  Negative for chest pain and leg swelling.  Gastrointestinal:  Negative for abdominal pain and vomiting.  Genitourinary:  Negative for dysuria.       Difficulty pooping  Musculoskeletal:  Negative for myalgias and neck pain.  Skin:  Negative for rash.  Neurological:  Negative for dizziness and headaches.   Physical Exam Updated Vital Signs BP 138/74   Pulse 80   Temp 98.2 F (36.8 C) (Oral)   Resp 16   Ht 5\' 8"  (1.727 m)   Wt 69.9 kg   SpO2 97%   BMI 23.42 kg/m   Physical Exam Vitals and nursing note reviewed.  Constitutional:      General: He is not in acute distress. HENT:     Head: Normocephalic and atraumatic.     Nose: Nose normal.  Eyes:     General: No scleral icterus. Cardiovascular:     Rate and Rhythm: Normal rate and regular rhythm.     Pulses: Normal pulses.     Heart sounds: Normal heart sounds.  Pulmonary:      Effort: Pulmonary effort is normal. No respiratory distress.     Breath sounds: No wheezing.  Abdominal:     Palpations: Abdomen is soft.     Tenderness: There is no abdominal tenderness.     Comments: Abdomen is soft nontender  Genitourinary:    Comments: Scrotum is nontender there is some small amount of bruising of the bilateral inguinal creases with well-healing incisions with no redness warmth or significant tenderness to palpation.  Hard stool palpable in rectal vault.  I was able to break stool into smaller pieces over the same unable to remove any.  No BRBPR Musculoskeletal:     Cervical back: Normal range of motion.     Right lower leg: No edema.     Left lower leg: No edema.  Skin:    General: Skin is warm and dry.     Capillary Refill: Capillary refill takes less than 2 seconds.  Neurological:     Mental Status: He is alert. Mental status is at baseline.  Psychiatric:        Mood and Affect: Mood normal.        Behavior: Behavior normal.    ED Results / Procedures / Treatments   Labs (all labs ordered are listed, but only abnormal results are displayed) Labs Reviewed  OCCULT BLOOD X 1 CARD TO LAB, STOOL    EKG None  Radiology DG Abdomen Acute W/Chest  Result Date: 11/22/2020 CLINICAL DATA:  constipation, recent surgery likely some ileus (still passing gas) EXAM: DG ABDOMEN ACUTE WITH 1 VIEW CHEST COMPARISON:  None. FINDINGS: The heart and mediastinal contours are within normal limits. Aortic calcifications. No focal consolidation. Coarsened interstitial markings with no overt pulmonary edema. No pleural effusion. No pneumothorax. There is no evidence of dilated bowel loops or free intraperitoneal air. Triangular 4 mm calcified density overlying the expected region of the left renal shadow. No acute osseous abnormality.  Degenerative changes of the spine. IMPRESSION: 1. No acute cardiopulmonary disease. 2. Nonobstructive bowel gas pattern. 3. Triangular 4 mm  calcified density overlying the expected region of the left renal shadow. Finding may represent nephrolithiasis. 4. Aortic Atherosclerosis (ICD10-I70.0) and Emphysema (ICD10-J43.9). Electronically Signed   By: Iven Finn M.D.   On: 11/22/2020 15:30    Procedures Fecal disimpaction  Date/Time: 11/22/2020 5:29 PM Performed by: Tedd Sias, PA Authorized by: Tedd Sias, PA  Consent: Verbal consent obtained. Risks and benefits: risks, benefits and alternatives were discussed Consent given by: patient Patient understanding: patient states understanding of  the procedure being performed Patient consent: the patient's understanding of the procedure matches consent given Relevant documents: relevant documents present and verified Test results: test results available and properly labeled Imaging studies: imaging studies available Patient identity confirmed: verbally with patient and arm band Local anesthesia used: no  Anesthesia: Local anesthesia used: no  Sedation: Patient sedated: no  Patient tolerance: patient tolerated the procedure well with no immediate complications Comments: Stool is broken up and subsequently passed by patient into toilet.     Medications Ordered in ED Medications - No data to display   ED Course  I have reviewed the triage vital signs and the nursing notes.  Pertinent labs & imaging results that were available during my care of the patient were reviewed by me and considered in my medical decision making (see chart for details).    MDM Rules/Calculators/A&P                          Patient currently taking Percocet after inguinal hernia surgery bilaterally.  He has been passing gas and urinating well has not almost no pain he states it has been titrating back on the Percocet however has had severe constipation and fullness of his rectal vault.  He has not tried manually disimpact.  He has been taking MiraLAX 1 cap per day.  He was not given  any additional stool softeners  On physical exam patient has no abdominal tenderness incisions appear clean and well.  He has no symptoms visit with an SBO other than difficulty pooping and he does have stool in the rectal vault.  Disimpaction successful.  Patient was able to pass stool after disimpaction.  Acute abdominal x-ray obtained which is negative for any free air or evidence of perforation.  Fecal occult negative  Offered more extensive work-up including imaging and labs patient declined states he feels completely improved has no symptoms currently at all.  Recommend follow-up with surgeon recommended more intensive bowel regimen with MiraLAX per discharge instructions.  Hydration and activity.  Final Clinical Impression(s) / ED Diagnoses Final diagnoses:  Drug-induced constipation    Rx / DC Orders ED Discharge Orders     None        Tedd Sias, [place] 11/22/20 1731    Gareth Morgan, MD 11/24/20 1826

## 2020-11-24 ENCOUNTER — Other Ambulatory Visit (HOSPITAL_BASED_OUTPATIENT_CLINIC_OR_DEPARTMENT_OTHER): Payer: Self-pay

## 2020-11-25 ENCOUNTER — Other Ambulatory Visit (HOSPITAL_BASED_OUTPATIENT_CLINIC_OR_DEPARTMENT_OTHER): Payer: Self-pay

## 2020-11-25 MED ORDER — ATORVASTATIN CALCIUM 20 MG PO TABS
20.0000 mg | ORAL_TABLET | Freq: Every day | ORAL | 3 refills | Status: DC
  Filled 2020-11-25 – 2020-12-08 (×2): qty 90, 90d supply, fill #0
  Filled 2021-04-01: qty 90, 90d supply, fill #1
  Filled 2021-07-21: qty 90, 90d supply, fill #2
  Filled 2021-10-29: qty 90, 90d supply, fill #3

## 2020-12-04 ENCOUNTER — Other Ambulatory Visit (HOSPITAL_BASED_OUTPATIENT_CLINIC_OR_DEPARTMENT_OTHER): Payer: Self-pay

## 2020-12-08 ENCOUNTER — Other Ambulatory Visit (HOSPITAL_BASED_OUTPATIENT_CLINIC_OR_DEPARTMENT_OTHER): Payer: Self-pay

## 2020-12-11 ENCOUNTER — Other Ambulatory Visit (HOSPITAL_BASED_OUTPATIENT_CLINIC_OR_DEPARTMENT_OTHER): Payer: Self-pay

## 2020-12-11 MED ORDER — INFLUENZA VAC A&B SA ADJ QUAD 0.5 ML IM PRSY
PREFILLED_SYRINGE | INTRAMUSCULAR | 0 refills | Status: DC
  Filled 2020-12-11: qty 0.5, 1d supply, fill #0

## 2021-04-01 ENCOUNTER — Other Ambulatory Visit (HOSPITAL_BASED_OUTPATIENT_CLINIC_OR_DEPARTMENT_OTHER): Payer: Self-pay

## 2021-07-21 ENCOUNTER — Other Ambulatory Visit (HOSPITAL_BASED_OUTPATIENT_CLINIC_OR_DEPARTMENT_OTHER): Payer: Self-pay

## 2021-10-29 ENCOUNTER — Other Ambulatory Visit (HOSPITAL_BASED_OUTPATIENT_CLINIC_OR_DEPARTMENT_OTHER): Payer: Self-pay

## 2021-10-29 MED ORDER — ZOSTER VAC RECOMB ADJUVANTED 50 MCG/0.5ML IM SUSR: INTRAMUSCULAR | 0 refills | Status: DC

## 2021-10-29 MED ORDER — BOOSTRIX 5-2.5-18.5 LF-MCG/0.5 IM SUSY
PREFILLED_SYRINGE | INTRAMUSCULAR | 0 refills | Status: DC
  Filled 2021-10-29: qty 0.5, 1d supply, fill #0

## 2021-12-28 ENCOUNTER — Other Ambulatory Visit (HOSPITAL_BASED_OUTPATIENT_CLINIC_OR_DEPARTMENT_OTHER): Payer: Self-pay

## 2021-12-28 MED ORDER — PENICILLIN V POTASSIUM 500 MG PO TABS
500.0000 mg | ORAL_TABLET | Freq: Three times a day (TID) | ORAL | 0 refills | Status: AC
  Filled 2021-12-28: qty 21, 7d supply, fill #0

## 2022-02-18 ENCOUNTER — Other Ambulatory Visit (HOSPITAL_BASED_OUTPATIENT_CLINIC_OR_DEPARTMENT_OTHER): Payer: Self-pay

## 2022-02-18 MED ORDER — ATORVASTATIN CALCIUM 20 MG PO TABS
20.0000 mg | ORAL_TABLET | Freq: Every day | ORAL | 0 refills | Status: DC
  Filled 2022-02-18: qty 90, 90d supply, fill #0

## 2022-02-23 ENCOUNTER — Other Ambulatory Visit (HOSPITAL_BASED_OUTPATIENT_CLINIC_OR_DEPARTMENT_OTHER): Payer: Self-pay

## 2022-03-29 ENCOUNTER — Other Ambulatory Visit (HOSPITAL_BASED_OUTPATIENT_CLINIC_OR_DEPARTMENT_OTHER): Payer: Self-pay

## 2022-03-29 MED ORDER — VARENICLINE TARTRATE (STARTER) 0.5 MG X 11 & 1 MG X 42 PO TBPK
ORAL_TABLET | ORAL | 0 refills | Status: DC
  Filled 2022-03-29 – 2022-04-09 (×2): qty 53, 28d supply, fill #0

## 2022-03-29 MED ORDER — VARENICLINE TARTRATE 1 MG PO TABS
1.0000 mg | ORAL_TABLET | Freq: Two times a day (BID) | ORAL | 1 refills | Status: DC
  Filled 2022-03-29 – 2022-04-09 (×2): qty 60, 30d supply, fill #0

## 2022-04-05 ENCOUNTER — Other Ambulatory Visit (HOSPITAL_BASED_OUTPATIENT_CLINIC_OR_DEPARTMENT_OTHER): Payer: Self-pay

## 2022-04-09 ENCOUNTER — Other Ambulatory Visit (HOSPITAL_BASED_OUTPATIENT_CLINIC_OR_DEPARTMENT_OTHER): Payer: Self-pay

## 2022-04-19 ENCOUNTER — Encounter (HOSPITAL_BASED_OUTPATIENT_CLINIC_OR_DEPARTMENT_OTHER): Payer: Self-pay

## 2022-04-19 ENCOUNTER — Emergency Department (HOSPITAL_BASED_OUTPATIENT_CLINIC_OR_DEPARTMENT_OTHER)
Admission: EM | Admit: 2022-04-19 | Discharge: 2022-04-19 | Disposition: A | Discharge status: Other Acute Inpatient Hospital | Payer: Medicare Other | Attending: Emergency Medicine | Admitting: Emergency Medicine

## 2022-04-19 ENCOUNTER — Inpatient Hospital Stay
Admission: AD | Admit: 2022-04-19 | Discharge: 2022-04-23 | DRG: 882 | Disposition: A | Discharge status: Home / Self Care | Payer: Medicare Other | Source: Intra-hospital | Attending: Psychiatry | Admitting: Psychiatry

## 2022-04-19 DIAGNOSIS — F4323 Adjustment disorder with mixed anxiety and depressed mood: Secondary | ICD-10-CM | POA: Insufficient documentation

## 2022-04-19 DIAGNOSIS — E785 Hyperlipidemia, unspecified: Secondary | ICD-10-CM | POA: Diagnosis present

## 2022-04-19 DIAGNOSIS — F17210 Nicotine dependence, cigarettes, uncomplicated: Secondary | ICD-10-CM | POA: Diagnosis present

## 2022-04-19 DIAGNOSIS — R45851 Suicidal ideations: Secondary | ICD-10-CM | POA: Insufficient documentation

## 2022-04-19 DIAGNOSIS — Z79899 Other long term (current) drug therapy: Secondary | ICD-10-CM

## 2022-04-19 DIAGNOSIS — Z20822 Contact with and (suspected) exposure to covid-19: Secondary | ICD-10-CM | POA: Diagnosis not present

## 2022-04-19 DIAGNOSIS — Z046 Encounter for general psychiatric examination, requested by authority: Secondary | ICD-10-CM | POA: Insufficient documentation

## 2022-04-19 DIAGNOSIS — Z560 Unemployment, unspecified: Secondary | ICD-10-CM | POA: Diagnosis not present

## 2022-04-19 LAB — RAPID URINE DRUG SCREEN, HOSP PERFORMED
Amphetamines: NOT DETECTED
Barbiturates: NOT DETECTED
Benzodiazepines: NOT DETECTED
Cocaine: NOT DETECTED
Opiates: NOT DETECTED
Tetrahydrocannabinol: NOT DETECTED

## 2022-04-19 LAB — URINALYSIS, ROUTINE W REFLEX MICROSCOPIC
Bilirubin Urine: NEGATIVE
Glucose, UA: NEGATIVE mg/dL
Hgb urine dipstick: NEGATIVE
Ketones, ur: NEGATIVE mg/dL
Leukocytes,Ua: NEGATIVE
Nitrite: NEGATIVE
Protein, ur: NEGATIVE mg/dL
Specific Gravity, Urine: 1.02 (ref 1.005–1.030)
pH: 7 (ref 5.0–8.0)

## 2022-04-19 LAB — CBC
HCT: 45.2 % (ref 39.0–52.0)
Hemoglobin: 15.8 g/dL (ref 13.0–17.0)
MCH: 31.9 pg (ref 26.0–34.0)
MCHC: 35 g/dL (ref 30.0–36.0)
MCV: 91.3 fL (ref 80.0–100.0)
Platelets: 200 10*3/uL (ref 150–400)
RBC: 4.95 MIL/uL (ref 4.22–5.81)
RDW: 13.1 % (ref 11.5–15.5)
WBC: 9.2 10*3/uL (ref 4.0–10.5)
nRBC: 0 % (ref 0.0–0.2)

## 2022-04-19 LAB — COMPREHENSIVE METABOLIC PANEL
ALT: 20 U/L (ref 0–44)
AST: 19 U/L (ref 15–41)
Albumin: 4.1 g/dL (ref 3.5–5.0)
Alkaline Phosphatase: 60 U/L (ref 38–126)
Anion gap: 9 (ref 5–15)
BUN: 12 mg/dL (ref 8–23)
CO2: 24 mmol/L (ref 22–32)
Calcium: 9.1 mg/dL (ref 8.9–10.3)
Chloride: 102 mmol/L (ref 98–111)
Creatinine, Ser: 0.83 mg/dL (ref 0.61–1.24)
GFR, Estimated: >60 mL/min (ref >60)
Glucose, Bld: 107 mg/dL — ABNORMAL HIGH (ref 70–99)
Potassium: 4.1 mmol/L (ref 3.5–5.1)
Sodium: 135 mmol/L (ref 135–145)
Total Bilirubin: 1 mg/dL (ref 0.3–1.2)
Total Protein: 7.3 g/dL (ref 6.5–8.1)

## 2022-04-19 LAB — ETHANOL: Alcohol, Ethyl (B): <10 mg/dL (ref <10)

## 2022-04-19 LAB — RESP PANEL BY RT-PCR (RSV, FLU A&B, COVID)  RVPGX2
Influenza A by PCR: NEGATIVE
Influenza B by PCR: NEGATIVE
Resp Syncytial Virus by PCR: NEGATIVE
SARS Coronavirus 2 by RT PCR: NEGATIVE

## 2022-04-19 LAB — ACETAMINOPHEN LEVEL: Acetaminophen (Tylenol), Serum: <10 ug/mL — ABNORMAL LOW (ref 10–30)

## 2022-04-19 LAB — SALICYLATE LEVEL: Salicylate Lvl: <7 mg/dL — ABNORMAL LOW (ref 7.0–30.0)

## 2022-04-19 MED ORDER — ALUM & MAG HYDROXIDE-SIMETH 200-200-20 MG/5ML PO SUSP: 30.0000 mL | ORAL | Status: DC | PRN

## 2022-04-19 MED ORDER — DIPHENHYDRAMINE HCL 50 MG/ML IJ SOLN: 50.0000 mg | Freq: Three times a day (TID) | INTRAMUSCULAR | Status: DC | PRN

## 2022-04-19 MED ORDER — DIPHENHYDRAMINE HCL 25 MG PO CAPS: 50.0000 mg | ORAL_CAPSULE | Freq: Three times a day (TID) | ORAL | Status: DC | PRN

## 2022-04-19 MED ORDER — ATORVASTATIN CALCIUM 10 MG PO TABS
20.0000 mg | ORAL_TABLET | Freq: Every day | ORAL | Status: DC
  Administered 2022-04-20 – 2022-04-23 (×4): 20 mg via ORAL
  Filled 2022-04-19 (×4): qty 2

## 2022-04-19 MED ORDER — LORAZEPAM 1 MG PO TABS: 0.5000 mg | ORAL_TABLET | Freq: Two times a day (BID) | ORAL | Status: DC

## 2022-04-19 MED ORDER — MAGNESIUM HYDROXIDE 400 MG/5ML PO SUSP: 30.0000 mL | Freq: Every day | ORAL | Status: DC | PRN

## 2022-04-19 MED ORDER — MIRTAZAPINE 15 MG PO TBDP
15.0000 mg | ORAL_TABLET | Freq: Every day | ORAL | Status: DC
  Filled 2022-04-19: qty 1

## 2022-04-19 MED ORDER — LORAZEPAM 1 MG PO TABS: 2.0000 mg | ORAL_TABLET | Freq: Three times a day (TID) | ORAL | Status: DC | PRN

## 2022-04-19 MED ORDER — LORAZEPAM 0.5 MG PO TABS
0.5000 mg | ORAL_TABLET | Freq: Two times a day (BID) | ORAL | Status: DC
  Administered 2022-04-20 – 2022-04-23 (×7): 0.5 mg via ORAL
  Filled 2022-04-19 (×7): qty 1

## 2022-04-19 MED ORDER — NICOTINE 14 MG/24HR TD PT24: 14.0000 mg | MEDICATED_PATCH | Freq: Every day | TRANSDERMAL | Status: DC

## 2022-04-19 MED ORDER — ACETAMINOPHEN 325 MG PO TABS: 650.0000 mg | ORAL_TABLET | Freq: Four times a day (QID) | ORAL | Status: DC | PRN

## 2022-04-19 MED ORDER — HALOPERIDOL 5 MG PO TABS: 5.0000 mg | ORAL_TABLET | Freq: Three times a day (TID) | ORAL | Status: DC | PRN

## 2022-04-19 MED ORDER — LORAZEPAM 2 MG/ML IJ SOLN: 2.0000 mg | Freq: Three times a day (TID) | INTRAMUSCULAR | Status: DC | PRN

## 2022-04-19 MED ORDER — MIRTAZAPINE 15 MG PO TBDP
15.0000 mg | ORAL_TABLET | Freq: Every day | ORAL | Status: DC
  Administered 2022-04-20 – 2022-04-22 (×3): 15 mg via ORAL
  Filled 2022-04-19 (×4): qty 1

## 2022-04-19 MED ORDER — NICOTINE 14 MG/24HR TD PT24
14.0000 mg | MEDICATED_PATCH | Freq: Every day | TRANSDERMAL | Status: DC
  Administered 2022-04-20 – 2022-04-23 (×4): 14 mg via TRANSDERMAL
  Filled 2022-04-19 (×4): qty 1

## 2022-04-19 MED ORDER — HALOPERIDOL LACTATE 5 MG/ML IJ SOLN: 5.0000 mg | Freq: Three times a day (TID) | INTRAMUSCULAR | Status: DC | PRN

## 2022-04-19 NOTE — ED Triage Notes (Signed)
Brought in by [facility] for [employer]. States recently found out his wife is cheating on him, lost his job today. States "has had enough", when asked if he has a plan states he was thinking of ways this morning.

## 2022-04-19 NOTE — ED Notes (Signed)
ED TO INPATIENT HANDOFF REPORT  ED Nurse Name and Phone #:  Leo Rod [facility] Paramedic [phone]  S Name/Age/Gender Brendan Caldwell 72 y.o. male Room/Bed: MH12/MH12  Code Status   Code Status: Full Code  Home/SNF/Other Home Patient oriented to: self, place, time, and situation Is this baseline? Yes   Triage Complete: Triage complete  Chief Complaint VOLUNTEER COMMITTMENT,  SUICIDAL  Triage Note Brought in by [facility] for SI. States recently found out his wife is cheating on him, lost his job today. States "has had enough", when asked if he has a plan states he was thinking of ways this morning.    Allergies No Known Allergies  Level of Care/Admitting Diagnosis ED Disposition     None       B Medical/Surgery History Past Medical History:  Diagnosis Date   Back pain    Hernia, inguinal    Hyperlipidemia    Past Surgical History:  Procedure Laterality Date   FINGER SURGERY     HERNIA REPAIR       A IV Location/Drains/Wounds Patient Lines/Drains/Airways Status     Active Line/Drains/Airways     None            Intake/Output Last 24 hours No intake or output data in the 24 hours ending 04/19/22 2228  Labs/Imaging Results for orders placed or performed during the hospital encounter of 04/19/22 (from the past 48 hour(s))  Comprehensive metabolic panel     Status: Abnormal   Collection Time: 04/19/22 10:53 AM  Result Value Ref Range   Sodium 135 135 - 145 mmol/L   Potassium 4.1 3.5 - 5.1 mmol/L   Chloride 102 98 - 111 mmol/L   CO2 24 22 - 32 mmol/L   Glucose, Bld 107 (H) 70 - 99 mg/dL    Comment: Glucose reference range applies only to samples taken after fasting for at least 8 hours.   BUN 12 8 - 23 mg/dL   Creatinine, Ser 0.83 0.61 - 1.24 mg/dL   Calcium 9.1 8.9 - 10.3 mg/dL   Total Protein 7.3 6.5 - 8.1 g/dL   Albumin 4.1 3.5 - 5.0 g/dL   AST 19 15 - 41 U/L   ALT 20 0 - 44 U/L   Alkaline Phosphatase 60 38 - 126 U/L   Total  Bilirubin 1.0 0.3 - 1.2 mg/dL   GFR, Estimated >60 >60 mL/min    Comment: (NOTE) Calculated using the CKD-EPI Creatinine Equation (2021)    Anion gap 9 5 - 15    Comment: Performed at [facility], [address] [place], [place] [address]  Ethanol     Status: None   Collection Time: 04/19/22 10:53 AM  Result Value Ref Range   Alcohol, Ethyl (B) <10 <10 mg/dL    Comment: (NOTE) Lowest detectable limit for serum alcohol is 10 mg/dL.  For medical purposes only. Performed at [facility], [address] [place], [place] [id number]   Salicylate level     Status: Abnormal   Collection Time: 04/19/22 10:53 AM  Result Value Ref Range   Salicylate Lvl [id number] (L) 7.0 - 30.0 mg/dL    Comment: Performed at [facility], [address] [place], [place] [address]  Acetaminophen level     Status: Abnormal   Collection Time: 04/19/22 10:53 AM  Result Value Ref Range   Acetaminophen (Tylenol), Serum <10 (L) 10 - 30 ug/mL    Comment: (NOTE) Therapeutic concentrations  vary significantly. A range of 10-30 ug/mL  may be an effective concentration for many patients. However, some  are best treated at concentrations outside of this range. Acetaminophen concentrations >150 ug/mL at 4 hours after ingestion  and >50 ug/mL at 12 hours after ingestion are often associated with  toxic reactions.  Performed at [facility], [address] [place], [place] [address]   cbc     Status: None   Collection Time: 04/19/22 10:53 AM  Result Value Ref Range   WBC 9.2 4.0 - 10.5 K/uL   RBC 4.95 4.22 - 5.81 MIL/uL   Hemoglobin 15.8 13.0 - 17.0 g/dL   HCT 45.2 39.0 - 52.0 %   MCV 91.3 80.0 - 100.0 fL   MCH 31.9 26.0 - 34.0 pg   MCHC 35.0 30.0 - 36.0 g/dL   RDW 13.1 11.5 - 15.5 %   Platelets 200 150 - 400 K/uL   nRBC 0.0 0.0 - 0.2 %    Comment: Performed at [facility], [address] [place], [place] [address]  Rapid urine drug screen (hospital  performed)     Status: None   Collection Time: 04/19/22 10:53 AM  Result Value Ref Range   Opiates NONE DETECTED NONE DETECTED   Cocaine NONE DETECTED NONE DETECTED   Benzodiazepines NONE DETECTED NONE DETECTED   Amphetamines NONE DETECTED NONE DETECTED   Tetrahydrocannabinol NONE DETECTED NONE DETECTED   Barbiturates NONE DETECTED NONE DETECTED    Comment: (NOTE) DRUG SCREEN FOR MEDICAL PURPOSES ONLY.  IF CONFIRMATION IS NEEDED FOR ANY PURPOSE, NOTIFY LAB WITHIN 5 DAYS.  LOWEST DETECTABLE LIMITS FOR URINE DRUG SCREEN Drug Class                     Cutoff (ng/mL) Amphetamine and metabolites    1000 Barbiturate and metabolites    200 Benzodiazepine                 200 Opiates and metabolites        300 Cocaine and metabolites        300 THC                            50 Performed at [facility], [address] [place], [place] [address]   Urinalysis, Routine w reflex microscopic -Urine, Clean Catch     Status: None   Collection Time: 04/19/22 10:53 AM  Result Value Ref Range   Color, Urine YELLOW YELLOW   APPearance CLEAR CLEAR   Specific Gravity, Urine 1.020 1.005 - 1.030   pH 7.0 5.0 - 8.0   Glucose, UA NEGATIVE NEGATIVE mg/dL   Hgb urine dipstick NEGATIVE NEGATIVE   Bilirubin Urine NEGATIVE NEGATIVE   Ketones, ur NEGATIVE NEGATIVE mg/dL   Protein, ur NEGATIVE NEGATIVE mg/dL   Nitrite NEGATIVE NEGATIVE   Leukocytes,Ua NEGATIVE NEGATIVE    Comment: Microscopic not done on urines with negative protein, blood, leukocytes, nitrite, or glucose < 500 mg/dL. Performed at [facility], [address] [place], [place] [address]   Resp panel by RT-PCR (RSV, Flu A&B, Covid) Nasopharyngeal Swab     Status: None   Collection Time: 04/19/22  4:31 PM   Specimen: Nasopharyngeal Swab; Nasal Swab  Result Value Ref Range   SARS Coronavirus 2 by RT PCR NEGATIVE NEGATIVE    Comment: (NOTE) SARS-CoV-2 target nucleic acids are NOT  DETECTED.  The SARS-CoV-2 RNA  is generally detectable in upper respiratory specimens during the acute phase of infection. The lowest concentration of SARS-CoV-2 viral copies this assay can detect is 138 copies/mL. A negative result does not preclude SARS-Cov-2 infection and should not be used as the sole basis for treatment or other patient management decisions. A negative result may occur with  improper specimen collection/handling, submission of specimen other than nasopharyngeal swab, presence of viral mutation(s) within the areas targeted by this assay, and inadequate number of viral copies(<138 copies/mL). A negative result must be combined with clinical observations, patient history, and epidemiological information. The expected result is Negative.  Fact Sheet for Patients:  EntrepreneurPulse.com.au  Fact Sheet for Healthcare Providers:  IncredibleEmployment.be  This test is no t yet approved or cleared by the [place] FDA and  has been authorized for detection and/or diagnosis of SARS-CoV-2 by FDA under an Emergency Use Authorization (EUA). This EUA will remain  in effect (meaning this test can be used) for the duration of the COVID-19 declaration under Section 564(b)(1) of the Act, 21 U.S.C.section 360bbb-3(b)(1), unless the authorization is terminated  or revoked sooner.       Influenza A by PCR NEGATIVE NEGATIVE   Influenza B by PCR NEGATIVE NEGATIVE    Comment: (NOTE) The Xpert Xpress SARS-CoV-2/FLU/RSV plus assay is intended as an aid in the diagnosis of influenza from Nasopharyngeal swab specimens and should not be used as a sole basis for treatment. Nasal washings and aspirates are unacceptable for Xpert Xpress SARS-CoV-2/FLU/RSV testing.  Fact Sheet for Patients: EntrepreneurPulse.com.au  Fact Sheet for Healthcare Providers: IncredibleEmployment.be  This test is not yet approved or cleared by the [place] FDA  and has been authorized for detection and/or diagnosis of SARS-CoV-2 by FDA under an Emergency Use Authorization (EUA). This EUA will remain in effect (meaning this test can be used) for the duration of the COVID-19 declaration under Section 564(b)(1) of the Act, 21 U.S.C. section 360bbb-3(b)(1), unless the authorization is terminated or revoked.     Resp Syncytial Virus by PCR NEGATIVE NEGATIVE    Comment: (NOTE) Fact Sheet for Patients: EntrepreneurPulse.com.au  Fact Sheet for Healthcare Providers: IncredibleEmployment.be  This test is not yet approved or cleared by the [place] FDA and has been authorized for detection and/or diagnosis of SARS-CoV-2 by FDA under an Emergency Use Authorization (EUA). This EUA will remain in effect (meaning this test can be used) for the duration of the COVID-19 declaration under Section 564(b)(1) of the Act, 21 U.S.C. section 360bbb-3(b)(1), unless the authorization is terminated or revoked.  Performed at [facility], [address] [place], [place] [address]    No results found.  Pending Labs Unresulted Labs (From admission, onward)     Start     Ordered   04/19/22 1633  TSH  Once,   URGENT        04/19/22 1632            Vitals/Pain Today's Vitals   04/19/22 1044 04/19/22 1047 04/19/22 1939  BP:  137/79 (!) 156/67  Pulse:  89 71  Resp:  18 16  Temp:  98.1 F (36.7 C) 97.9 F (36.6 C)  TempSrc:  Oral Oral  SpO2:  100% 95%  Weight: 140 lb (63.5 kg)    Height: 5\' 8"  (1.727 m)    PainSc: 0-No pain      Isolation Precautions Airborne and Contact precautions  Medications Medications  mirtazapine (REMERON SOL-TAB) disintegrating  tablet 15 mg (has no administration in time range)  LORazepam (ATIVAN) tablet 0.5 mg (has no administration in time range)  nicotine (NICODERM CQ - dosed in mg/24 hours) patch 14 mg (has no administration in time range)    Mobility walks      Focused Assessments Psych  R Recommendations: See Admitting Provider Note  Report given to:   Additional Notes: [place] [facility] to transport patient to [facility]

## 2022-04-19 NOTE — ED Notes (Signed)
Called lab to add urinalysis onto previously collected urine

## 2022-04-19 NOTE — ED Notes (Signed)
TTS on video with patient.
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
## 2022-04-19 NOTE — Progress Notes (Addendum)
Per [facility] Le[place] Reader, RN Pt has a tentative bed offer for pt to transfer to Bed Assignment- L29 Gero[facility] at [place]. [place], [place]. PENDING: labs and pcr covid must be resulted prior to transfer [place] 04/19/22  Pt meets inpatient criteria per Merlyn Lot, NP  Attending Physician will be Dr. Caren Griffins, DO  RN TO RN report [phone]. Please fax IVC paperwork [phone].  Pt can arrive after [place] will coordinate with Charge RN and care team  Care Team notified: [facility] [place] Reader, RN, Markus Daft, Paramedic, Rex Kras, PA, Athena Masse, RN, Merlyn Lot, NP, Maxie Better, RN, [place], LCSWA, Earleen Newport, NP, Thomes Lolling, NP, Hampton Abbot, MD  Nadara Mode, LCSWA 04/19/2022 @ 4:46 PM

## 2022-04-19 NOTE — ED Notes (Signed)
Cabinets locked, room secured and potentially hazardous materials removed from room. Changed into hospital acquired scrubs, clothing placed in belongings bag and removed from room, valuables locked with security.

## 2022-04-19 NOTE — ED Notes (Signed)
ED Provider at bedside. 

## 2022-04-19 NOTE — ED Notes (Addendum)
[employer] wallet with [phone] and cards and keys with lighter and cell phone sent to security.

## 2022-04-19 NOTE — ED Notes (Signed)
Pt assigned Gero[facility] bed at [facility]. Awaiting finalization of IVC paperwork prior to sending to room.

## 2022-04-19 NOTE — ED Notes (Signed)
Snack and drink provided to patient.  

## 2022-04-19 NOTE — ED Provider Notes (Signed)
Brendan Caldwell   CSN: [id number] Arrival date & time: 04/19/22  1036     History  Chief Complaint  Patient presents with   Suicidal    Brendan Caldwell is a 72 y.o. male with a past medical history of hyperlipidemia brought in by brought in by [employer] today for evaluation of suicidal ideation.  Patient reports he recently went through divorce and moved out of his house.  States his wife has been cheating with another guy.  He has plan of "taking pills".  Denies thought of hurting other people.  States he has 2 children who still live in the house so he decided to move out and give everything to his wife.  HPI    Past Medical History:  Diagnosis Date   Back pain    Hernia, inguinal    Hyperlipidemia    Past Surgical History:  Procedure Laterality Date   FINGER SURGERY     HERNIA REPAIR       Home Medications Prior to Admission medications   Medication Sig Start Date End Date Taking? Authorizing Provider  atorvastatin (LIPITOR) 20 MG tablet TAKE 1 TABLET BY MOUTH ONCE DAILY 09/19/19 09/18/20  Kristopher Glee., MD  atorvastatin (LIPITOR) 20 MG tablet TAKE 1 TABLET BY MOUTH ONCE DAILY 11/25/20     atorvastatin (LIPITOR) 20 MG tablet Take 1 tablet (20 mg total) by mouth daily. 02/18/22     benzonatate (TESSALON) 100 MG capsule Take 1 capsule (100 mg total) by mouth 3 (three) times daily as needed for cough. 07/09/18   Julianne Rice, MD  cephALEXin (KEFLEX) 500 MG capsule Take 1 capsule (500 mg total) by mouth 3 (three) times daily. 06/25/16   Julianne Rice, MD  clindamycin (CLEOCIN) 300 MG capsule Take 1 capsule (300 mg total) by mouth 4 (four) times daily. X 7 days 06/11/18   Veryl Speak, MD  COVID-19 mRNA vaccine, Moderna, 100 MCG/0.5ML injection Inject into the muscle. 08/18/20   Carlyle Basques, MD  HYDROcodone-acetaminophen (NORCO) 5-325 MG tablet Take 1-2 tablets by mouth every 6 (six) hours as needed. 06/11/18    Veryl Speak, MD  influenza vaccine adjuvanted (FLUAD) 0.5 ML injection Inject into the muscle. 12/11/20   Carlyle Basques, MD  penicillin v potassium (VEETID) 500 MG tablet Take 1 tablet (500 mg total) by mouth 3 (three) times daily for 7 days 12/28/21     permethrin (ELIMITE) 5 % cream Apply to affected area once 06/25/16   Julianne Rice, MD  Tdap Durwin Reges) 5-2.5-18.5 LF-MCG/0.5 injection Inject into the muscle. 10/29/21   Carlyle Basques, MD  varenicline (APO-VARENICLINE) 1 MG tablet Take 1 tablet (1 mg total) by mouth 2 (two) times daily. 03/29/22     Varenicline Tartrate, Starter, 0.5 MG X 11 & 1 MG X 42 TBPK Take 0.5 mg by mouth daily for 3 days, THEN 0.5 mg 2 (two) times daily for 4 days, THEN 1 mg 2 (two) times daily for 21 days. 03/29/22 05/07/22    Zoster Vaccine Adjuvanted [facility]) injection Inject into the muscle. 05/19/21   Carlyle Basques, MD      Allergies    Patient has no known allergies.    Review of Systems   Review of Systems Negative except as per HPI.  Physical Exam Updated Vital Signs BP 137/79 (BP Location: Right Arm)   Pulse 89   Temp 98.1 F (36.7 C) (Oral)   Resp 18   Ht 5\' 8"  (1.727  m)   Wt 63.5 kg   SpO2 100%   BMI 21.29 kg/m  Physical Exam Vitals and nursing Caldwell reviewed.  Constitutional:      Appearance: Normal appearance.  HENT:     Head: Normocephalic and atraumatic.     Mouth/Throat:     Mouth: Mucous membranes are moist.  Eyes:     General: No scleral icterus. Cardiovascular:     Rate and Rhythm: Normal rate and regular rhythm.     Pulses: Normal pulses.     Heart sounds: Normal heart sounds.  Pulmonary:     Effort: Pulmonary effort is normal.     Breath sounds: Normal breath sounds.  Abdominal:     General: Abdomen is flat.     Palpations: Abdomen is soft.     Tenderness: There is no abdominal tenderness.  Musculoskeletal:        General: No deformity.  Skin:    General: Skin is warm.     Findings: No rash.  Neurological:      General: No focal deficit present.     Mental Status: He is alert.  Psychiatric:        Thought Content: Thought content normal.     Comments: Patient appears calm and cooperative.  He answers all my questions appropriately.     ED Results / Procedures / Treatments   Labs (all labs ordered are listed, but only abnormal results are displayed) Labs Reviewed  COMPREHENSIVE METABOLIC PANEL - Abnormal; Notable for the following components:      Result Value   Glucose, Bld 107 (*)    All other components within normal limits  SALICYLATE LEVEL - Abnormal; Notable for the following components:   Salicylate Lvl [id number] (*)    All other components within normal limits  ACETAMINOPHEN LEVEL - Abnormal; Notable for the following components:   Acetaminophen (Tylenol), Serum <10 (*)    All other components within normal limits  RESP PANEL BY RT-PCR (RSV, FLU A&B, COVID)  [id number]  ETHANOL  CBC  RAPID URINE DRUG SCREEN, HOSP PERFORMED  TSH  URINALYSIS, ROUTINE W REFLEX MICROSCOPIC    EKG EKG Interpretation  Date/Time:  Monday April 19 2022 11:30:55 EDT Ventricular Rate:  75 PR Interval:  148 QRS Duration: 92 QT Interval:  401 QTC Calculation: 448 R Axis:   72 Text Interpretation: Sinus rhythm Consider left ventricular hypertrophy Confirmed by Octaviano Glow [phone]) on 04/19/2022 11:33:23 AM  Radiology No results found.  Procedures Procedures    Medications Ordered in ED Medications  mirtazapine (REMERON SOL-TAB) disintegrating tablet 15 mg (has no administration in time range)  LORazepam (ATIVAN) tablet 0.5 mg (has no administration in time range)  nicotine (NICODERM CQ - dosed in mg/24 hours) patch 14 mg (has no administration in time range)    ED Course/ Medical Decision Making/ A&P                             Medical Decision Making Amount and/or Complexity of Data Reviewed Labs: ordered.   This patient presents to the ED for SI, this involves an extensive number  of treatment options, and is a complaint that carries with a high risk of complications and morbidity.  The differential diagnosis includes/HI, psychosis, electrolyte abnormality.  This is not an exhaustive list.  Lab tests: I ordered and personally interpreted labs.  The pertinent results include: WBC unremarkable. Hbg unremarkable. Platelets unremarkable. Electrolytes unremarkable. BUN, creatinine unremarkable.  UA pending  Imaging studies:  Problem list/ ED course/ Critical interventions/ Medical management: HPI: See above Vital signs within normal range and stable throughout visit. Laboratory/imaging studies significant for: See above. On physical examination, patient is afebrile and appears in no acute distress. Presentation not consistent with acute organic causes to include delirium, dementia or drug induced disorders (acute ingestions or withdrawal; no evidence of toxidrome). Given the H&P, I suspect this patient is SI and patient was placed on psych hold. Psychiatry was consulted and continued patient's hold. Patient was medically cleared and plan to transfer to psychiatric care. I have reviewed the patient home medicines and have made adjustments as needed.  Cardiac monitoring/EKG: The patient was maintained on a cardiac monitor.  I personally reviewed and interpreted the cardiac monitor which showed an underlying rhythm of: sinus rhythm.  Additional history obtained: External records from outside source obtained and reviewed including: Chart review including previous notes, labs, imaging.  Consultations obtained:  Disposition Patient will be transferred to behavioral health unit. This chart was dictated using voice recognition software.  Despite best efforts to proofread,  errors can occur which can change the documentation meaning.          Final Clinical Impression(s) / ED Diagnoses Final diagnoses:  Suicidal ideation    Rx / DC Orders ED Discharge Orders      None         Rex Kras, [place] 04/19/22 1635    Wyvonnia Dusky, MD 04/20/22 1021

## 2022-04-19 NOTE — ED Notes (Signed)
Received paper work from [employer] .  Called [employer] for transport at 8:30pm

## 2022-04-20 ENCOUNTER — Other Ambulatory Visit: Payer: Self-pay

## 2022-04-20 ENCOUNTER — Encounter: Payer: Self-pay | Admitting: Adult Health

## 2022-04-20 DIAGNOSIS — F4323 Adjustment disorder with mixed anxiety and depressed mood: Secondary | ICD-10-CM

## 2022-04-20 MED ORDER — ADULT MULTIVITAMIN W/MINERALS CH
1.0000 | ORAL_TABLET | Freq: Every day | ORAL | Status: DC
  Administered 2022-04-20 – 2022-04-23 (×4): 1 via ORAL
  Filled 2022-04-20 (×4): qty 1

## 2022-04-20 MED ORDER — ENSURE ENLIVE PO LIQD
237.0000 mL | Freq: Three times a day (TID) | ORAL | Status: DC
  Administered 2022-04-20 – 2022-04-23 (×10): 237 mL via ORAL
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
## 2022-04-20 NOTE — Group Note (Signed)
Recreation Therapy Group Note   Group Topic:Healthy Support Systems  Group Date: 04/20/2022 Start Time: 1400 End Time: 1455 Facilitators: Vilma Prader, LRT, CTRS Location:  Dayroom  Group Description: Straw Bridge. Patients were given 10 plastic drinking straws and an equal length of masking tape. Using the materials provided, patients were instructed to build a free-standing bridge-like structure to suspend an everyday item (ex: deck of cards) off the floor or table surface. All materials were required to be used in Conservation officer, nature. LRT facilitated post-activity discussion reviewing the importance of having strong and healthy support systems in our lives. LRT discussed how the people in our lives serve as the tape and the book we placed on top of our straw structure are the stressors we face in daily life. LRT and pts discussed what happens in our life when things get too heavy for [place], and we don't have strong supports outside of the hospital. Pt shared 2 of their healthy supports aloud in the group.    Affect/Mood: N/A   Participation Level: Did not attend    Clinical Observations/Individualized Feedback: Brendan Caldwell did not attend group due to resting in his room.  Plan: Continue to engage patient in RT group sessions 2-3x/week.   Vilma Prader, LRT, CTRS 04/20/2022 3:47 PM

## 2022-04-20 NOTE — Progress Notes (Signed)
Jivan arrived on this unit @ 2346 with Security.  Tiffany, MHT was present during the beginning of thi admission.  The patient was alert & oriented x 4.  He reported being here due to "psychological reasons".  He was sad & depressed yet very cooperative.  He was able to answer all questions appropriately.   He ambulated around the room & to the restroom without difficulty.  He was shocked to see the amount of weight he had loss in the last 3 weeks.  He estimated a 15 pound weight loss within 3 weeks.  This was not intentional - he has not had an appetite since he left his wife of 29 years.    He denied SI, HI, delusions, and hallucinations.  He reported feeling "depressed & hopeless".  He reported that "this was a expensive way to get somebody to talk to".    Head-to-toe assessment completed.  Tabitha had one circular red appropriate 1/2 inch in diameter on his right wrist that he reported "it's probably form my cat".    He denied pain & discomfort.  No acute distress observed.    He agreed to treatment and is goal oriented.  He is looking forward to discharge when deemed ready, and stated that he hopes to "get a lot of this out & get out of here".  Initiate care plan as it is written.

## 2022-04-20 NOTE — Progress Notes (Signed)
D: Patient alert and oriented times 4. Pt denies SI, HI, AVH. Pt denies pain at this time. Pt endorses anxiety and depression. Patient continues to worry about his moving trailer and his cat. Patient states he had a moment of depression and it landed him here. No other concerns noted at this time.   A: Pt provided support and encouragement throughout the day. Scheduled medications given as prescribed. Takes medications whole without issue.    R: Pt remains safe on the unit with Q15 min safety checks in place. Will continue to monitor for changes.

## 2022-04-20 NOTE — [facility] Group Notes (Signed)
[facility] Group Notes:  (Nursing/MHT/Case Management/Adjunct)  Date:  04/20/2022  Time:  4:16 PM  Type of Therapy:   community meeting  Participation Level:  Did Not Attend    [name] 04/20/2022, 4:16 PM

## 2022-04-20 NOTE — Progress Notes (Signed)
NUTRITION ASSESSMENT  Pt identified as at risk on the Malnutrition Screen Tool  INTERVENTION:  -Continue with regular diet -Ensure Enlive po TID, each supplement provides 350 kcal and 20 grams of protein -MVI with minerals daily  NUTRITION DIAGNOSIS: Unintentional weight loss related to sub-optimal intake as evidenced by pt report.   Goal: Pt to meet >/= 90% of their estimated nutrition needs.  Monitor:  PO intake  Assessment:  Pt admitted with adjustment disorder with mixed anxiety, depressed mood, and SI.   Pt with multiple social stressors, including loss of job and pending divorce. Pt is currently living in a travel trailer in my friend's yard.   Pt currently on a regular diet. No meal completion data available to assess at this time.   Per H&P, pt endorses poor appetite and a 15# wt loss over the past month. Reviewed wt hx; noted distant history of weight loss. Per Care[facility]where, wt was 147# on 05/19/21; pt has experienced a 8% wt loss over the past 11 months.   Pt would greatly benefit from addition of oral nutrition supplements.   Medications reviewed and include remeron.   Labs reviewed.   72 y.o. male  Height: Ht Readings from Last 1 Encounters:  04/20/22 5\' 6"  (1.676 m)    Weight: Wt Readings from Last 1 Encounters:  04/20/22 61.5 kg    Weight Hx: Wt Readings from Last 10 Encounters:  04/20/22 61.5 kg  04/19/22 63.5 kg  11/22/20 69.9 kg  07/09/18 71.2 kg  06/11/18 70.8 kg  06/25/16 70.8 kg  04/25/15 74.8 kg  04/16/13 75.8 kg  03/25/13 75.8 kg  03/16/13 74.3 kg    BMI:  Body mass index is 21.87 kg/m. BMI WDL.   Estimated Nutritional Needs: Kcal: 25-30 kcal/kg Protein: > 1 gram protein/kg Fluid: 1 ml/kcal  Diet Order:  Diet Order             Diet regular Room service appropriate? No; Fluid consistency: Thin  Diet effective now                  Pt is also offered choice of unit snacks mid-morning and mid-afternoon.  Pt is eating  as desired.   Lab results and medications reviewed.   Loistine Chance, RD, LDN, [place] Registered Dietitian II Certified Diabetes Care and Education Specialist Please refer to [facility] for RD and/or RD on-call/weekend/after hours pager

## 2022-04-20 NOTE — Progress Notes (Signed)
   04/20/22 0730  Psych Admission Type (Psych Patients Only)  Admission Status Involuntary  Psychosocial Assessment  Patient Complaints Depression  Eye Contact Fair  Facial Expression Angry;Worried  Affect Angry;Irritable  Catering manager Activity Other (Comment) (WDL)  Appearance/Hygiene In scrubs  Behavior Characteristics Agitated;Anxious  Mood Anxious;Angry;Irritable  Thought Process  Coherency WDL  Content WDL  Delusions None reported or observed  Perception WDL  Hallucination None reported or observed  Judgment Impaired  Confusion None  Danger to Self  Current suicidal ideation? Denies  Danger to Others  Danger to Others None reported or observed
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
## 2022-04-20 NOTE — Progress Notes (Signed)
   04/20/22 1935  Psych Admission Type (Psych Patients Only)  Admission Status Involuntary  Psychosocial Assessment  Patient Complaints None  Eye Contact Fair  Facial Expression Animated  Affect Sad  Speech Logical/coherent;Pressured  Interaction Assertive  Motor Activity Other (Comment) (wnl)  Appearance/Hygiene In scrubs  Behavior Characteristics Cooperative  Mood Sad  Thought Process  Coherency WDL  Content WDL  Delusions None reported or observed  Perception WDL  Hallucination None reported or observed  Judgment Impaired  Confusion None  Danger to Self  Current suicidal ideation? Denies  Danger to Others  Danger to Others None reported or observed   Progress note   D: Pt seen at nurse's station. Pt denies SI, HI, AVH. Pt rates pain  0/10. Pt rates anxiety  /010 and depression  0/10 Pt was anxious this morning but now he is a little more relaxed. "I've decided that I'm here so I'm going to just go with it until they let me out. Everything is taken care of on the outside so I'm good." Pt has a cat named Pearl who lives with him in his travel trailer. His daughter is taking care of her. Pt notified his family so they know his whereabouts now. Pt still pressured speech. Going to groups and interacting with staff and other patients. States he is sad about all that has happened with his marriage. Says he is not wanting to kill himself. Says he needs to be there for his children. "Even though we are getting divorced, they are still my children and I have to do right by them. I've been lucky to have good kids." No other concerns noted at this time.  A: Pt provided support and encouragement. Pt given scheduled medication as prescribed. PRNs as appropriate. Q15 min checks for safety.   R: Pt safe on the unit. Will continue to monitor.

## 2022-04-20 NOTE — Group Note (Signed)
[place] LCSW Group Therapy Note   Group Date: 04/20/2022 Start Time: 1300 End Time: 1400   Type of Therapy/Topic:  Group Therapy:  Emotion Regulation  Participation Level:  Did Not Attend   Mood:  Description of Group:    The purpose of this group is to assist patients in learning to regulate negative emotions and experience positive emotions. Patients will be guided to discuss ways in which they have been vulnerable to their negative emotions. These vulnerabilities will be juxtaposed with experiences of positive emotions or situations, and patients challenged to use positive emotions to combat negative ones. Special emphasis will be placed on coping with negative emotions in conflict situations, and patients will process healthy conflict resolution skills.  Therapeutic Goals: Patient will identify two positive emotions or experiences to reflect on in order to balance out negative emotions:  Patient will label two or more emotions that they find the most difficult to experience:  Patient will be able to demonstrate positive conflict resolution skills through discussion or role plays:   Summary of Patient Progress:   X    Therapeutic Modalities:   Cognitive Behavioral Therapy Feelings Identification Dialectical Behavioral Therapy   Raymone Pembroke A [place], LCSWA

## 2022-04-20 NOTE — Plan of Care (Signed)
  Problem: Education: Goal: Knowledge of General Education information will improve Description: Including pain rating scale, medication(s)/side effects and non-pharmacologic comfort measures Outcome: Not Progressing   Problem: Health Behavior/Discharge Planning: Goal: Ability to manage health-related needs will improve Outcome: Not Progressing   Problem: Clinical Measurements: Goal: Ability to maintain clinical measurements within normal limits will improve Outcome: Not Progressing Goal: Will remain free from infection Outcome: Not Progressing Goal: Diagnostic test results will improve Outcome: Not Progressing Goal: Respiratory complications will improve Outcome: Not Progressing Goal: Cardiovascular complication will be avoided Outcome: Not Progressing   Problem: Nutrition: Goal: Adequate nutrition will be maintained Outcome: Not Progressing   Problem: Coping: Goal: Level of anxiety will decrease Outcome: Not Progressing   Problem: Elimination: Goal: Will not experience complications related to bowel motility Outcome: Not Progressing Goal: Will not experience complications related to urinary retention Outcome: Not Progressing   Problem: Skin Integrity: Goal: Risk for impaired skin integrity will decrease Outcome: Not Progressing   Problem: Safety: Goal: Ability to remain free from injury will improve Outcome: Not Progressing   

## 2022-04-21 DIAGNOSIS — F4323 Adjustment disorder with mixed anxiety and depressed mood: Secondary | ICD-10-CM | POA: Diagnosis not present

## 2022-04-21 NOTE — Group Note (Signed)
LCSW Group Therapy Note  Group Date: 04/21/2022 Start Time: 1300 End Time: 1400   Type of Therapy and Topic:  Group Therapy - How To Cope with Nervousness about Discharge   Participation Level:  Minimal   Description of Group This process group involved identification of patients' feelings about discharge. Some of them are scheduled to be discharged soon, while others are new admissions, but each of them was asked to share thoughts and feelings surrounding discharge from the hospital. One common theme was that they are excited at the prospect of going home, while another was that many of them are apprehensive about sharing why they were hospitalized. Patients were given the opportunity to discuss these feelings with their peers in preparation for discharge.  Therapeutic Goals  Patient will identify their overall feelings about pending discharge. Patient will think about how they might proactively address issues that they believe will once again arise once they get home (i.e. with parents). Patients will participate in discussion about having hope for change.   Summary of Patient Progress:   He was very active throughout the session. He demonstrated fair insight into the subject matter, and proved open to input from peers and feedback from [place]. He stayed in group momentarily but stated he was too chilly to stay outside and left group early. He was respectful of peers and participated throughout the entire session.   Therapeutic Modalities Cognitive Behavioral Therapy   Nathaniel Wakeley A [place], LCSWA 04/21/2022  2:08 PM

## 2022-04-21 NOTE — Progress Notes (Signed)
   04/21/22 2031  Psych Admission Type (Psych Patients Only)  Admission Status Involuntary  Psychosocial Assessment  Patient Complaints None  Eye Contact Fair  Facial Expression Animated  Affect Appropriate to circumstance  Speech Logical/coherent;Pressured  Interaction Assertive  Motor Activity Other (Comment) (wnl)  Appearance/Hygiene In scrubs  Behavior Characteristics Cooperative  Mood Anxious  Thought Process  Coherency WDL  Content WDL  Delusions None reported or observed  Perception WDL  Hallucination None reported or observed  Judgment Impaired  Confusion None  Danger to Self  Current suicidal ideation? Denies  Danger to Others  Danger to Others None reported or observed   Progress note   D: Pt seen at the nurse's station. Pt denies SI, HI, AVH. Pt rates pain  0/10. Pt rates anxiety  0/10 and depression  0/10. Pt still with pressured speech. Says he is "over" his situation with his wife, but continues to return to that subject whenever he is engaged in conversation. Pt unsatisfied that he has to stay until Friday. "I should be out looking for a job and getting things straight." Then, the next time he speaks, "I'm fine with being here until Friday. I'm gonna go with it." No issues with medications. Pt eating and sleeping well. Attending groups and participating. No other concerns noted at this time.  A: Pt provided support and encouragement. Pt given scheduled medication as prescribed. PRNs as appropriate. Q15 min checks for safety.   R: Pt safe on the unit. Will continue to monitor.

## 2022-04-21 NOTE — BH IP Treatment Plan (Signed)
Interdisciplinary Treatment and Diagnostic Plan Update  04/21/2022 Time of Session: 11:00AM Brendan Caldwell MRN: [id number]  Principal Diagnosis: Adjustment disorder with mixed anxiety and depressed mood  Secondary Diagnoses: Principal Problem:   Adjustment disorder with mixed anxiety and depressed mood   Current Medications:  Current Facility-Administered Medications  Medication Dose Route Frequency Provider Last Rate Last Admin   acetaminophen (TYLENOL) tablet 650 mg  650 mg Oral Q6H PRN Parks Ranger, DO       alum & mag hydroxide-simeth (MAALOX/MYLANTA) 200-200-20 MG/5ML suspension 30 mL  30 mL Oral Q4H PRN Parks Ranger, DO       atorvastatin (LIPITOR) tablet 20 mg  20 mg Oral Daily Parks Ranger, DO   20 mg at 04/21/22 [id number]   diphenhydrAMINE (BENADRYL) capsule 50 mg  50 mg Oral TID PRN Parks Ranger, DO       Or   diphenhydrAMINE (BENADRYL) injection 50 mg  50 mg Intramuscular TID PRN Parks Ranger, DO       feeding supplement (ENSURE ENLIVE / ENSURE PLUS) liquid 237 mL  237 mL Oral TID BM Parks Ranger, DO   237 mL at 04/21/22 0848   haloperidol (HALDOL) tablet 5 mg  5 mg Oral TID PRN Parks Ranger, DO       Or   haloperidol lactate (HALDOL) injection 5 mg  5 mg Intramuscular TID PRN Parks Ranger, DO       LORazepam (ATIVAN) tablet 2 mg  2 mg Oral TID PRN Parks Ranger, DO       Or   LORazepam (ATIVAN) injection 2 mg  2 mg Intramuscular TID PRN Parks Ranger, DO       LORazepam (ATIVAN) tablet 0.5 mg  0.5 mg Oral BID Parks Ranger, DO   0.5 mg at 04/21/22 0834   magnesium hydroxide (MILK OF MAGNESIA) suspension 30 mL  30 mL Oral Daily PRN Parks Ranger, DO       mirtazapine (REMERON SOL-TAB) disintegrating tablet 15 mg  15 mg Oral QHS Parks Ranger, DO   15 mg at 04/20/22 2150   multivitamin with minerals tablet 1 tablet  1 tablet Oral Daily Parks Ranger, DO   1 tablet at 04/21/22 [id number]   nicotine (NICODERM CQ - dosed in mg/24 hours) patch 14 mg  14 mg Transdermal Daily Parks Ranger, DO   14 mg at 04/21/22 [id number]   PTA Medications: Medications Prior to Admission  Medication Sig Dispense Refill Last Dose   atorvastatin (LIPITOR) 20 MG tablet TAKE 1 TABLET BY MOUTH ONCE DAILY 90 tablet 3    atorvastatin (LIPITOR) 20 MG tablet TAKE 1 TABLET BY MOUTH ONCE DAILY 90 tablet 3    atorvastatin (LIPITOR) 20 MG tablet Take 1 tablet (20 mg total) by mouth daily. 90 tablet 0    benzonatate (TESSALON) 100 MG capsule Take 1 capsule (100 mg total) by mouth 3 (three) times daily as needed for cough. 21 capsule 0    cephALEXin (KEFLEX) 500 MG capsule Take 1 capsule (500 mg total) by mouth 3 (three) times daily. 21 capsule 0    clindamycin (CLEOCIN) 300 MG capsule Take 1 capsule (300 mg total) by mouth 4 (four) times daily. X 7 days 28 capsule 0    COVID-19 mRNA vaccine, Moderna, 100 MCG/0.5ML injection Inject into the muscle. 0.25 mL 0    HYDROcodone-acetaminophen (NORCO) 5-325 MG tablet Take 1-2 tablets by mouth every 6 (six)  hours as needed. 10 tablet 0    influenza vaccine adjuvanted (FLUAD) 0.5 ML injection Inject into the muscle. 0.5 mL 0    penicillin v potassium (VEETID) 500 MG tablet Take 1 tablet (500 mg total) by mouth 3 (three) times daily for 7 days 21 tablet 0    permethrin (ELIMITE) 5 % cream Apply to affected area once 60 g 1    Tdap (BOOSTRIX) 5-2.5-18.5 LF-MCG/0.5 injection Inject into the muscle. 0.5 mL 0    varenicline (APO-VARENICLINE) 1 MG tablet Take 1 tablet (1 mg total) by mouth 2 (two) times daily. 60 tablet 1    Varenicline Tartrate, Starter, 0.5 MG X 11 & 1 MG X 42 TBPK Take 0.5 mg by mouth daily for 3 days, THEN 0.5 mg 2 (two) times daily for 4 days, THEN 1 mg 2 (two) times daily for 21 days. 53 each 0    Zoster Vaccine Adjuvanted [facility]) injection Inject into the muscle. 1 each 0     Patient Stressors:     Patient Strengths:    Treatment Modalities: Medication Management, Group therapy, Case management,  1 to 1 session with clinician, Psychoeducation, Recreational therapy.   Physician Treatment Plan for Primary Diagnosis: Adjustment disorder with mixed anxiety and depressed mood Long Term Goal(s): Improvement in symptoms so as ready for discharge   Short Term Goals: Ability to identify changes in lifestyle to reduce recurrence of condition will improve Ability to verbalize feelings will improve Ability to disclose and discuss suicidal ideas Ability to demonstrate self-control will improve Ability to identify and develop effective coping behaviors will improve Ability to maintain clinical measurements within normal limits will improve Compliance with prescribed medications will improve Ability to identify triggers associated with substance abuse/mental health issues will improve  Medication Management: Evaluate patient's response, side effects, and tolerance of medication regimen.  Therapeutic Interventions: 1 to 1 sessions, Unit Group sessions and Medication administration.  Evaluation of Outcomes: Not Met  Physician Treatment Plan for Secondary Diagnosis: Principal Problem:   Adjustment disorder with mixed anxiety and depressed mood  Long Term Goal(s): Improvement in symptoms so as ready for discharge   Short Term Goals: Ability to identify changes in lifestyle to reduce recurrence of condition will improve Ability to verbalize feelings will improve Ability to disclose and discuss suicidal ideas Ability to demonstrate self-control will improve Ability to identify and develop effective coping behaviors will improve Ability to maintain clinical measurements within normal limits will improve Compliance with prescribed medications will improve Ability to identify triggers associated with substance abuse/mental health issues will improve     Medication Management: Evaluate  patient's response, side effects, and tolerance of medication regimen.  Therapeutic Interventions: 1 to 1 sessions, Unit Group sessions and Medication administration.  Evaluation of Outcomes: Not Met   RN Treatment Plan for Primary Diagnosis: Adjustment disorder with mixed anxiety and depressed mood Long Term Goal(s): Knowledge of disease and therapeutic regimen to maintain health will improve  Short Term Goals: Ability to remain free from injury will improve, Ability to verbalize frustration and anger appropriately will improve, Ability to demonstrate self-control, Ability to participate in decision making will improve, Ability to verbalize feelings will improve, Ability to disclose and discuss suicidal ideas, Ability to identify and develop effective coping behaviors will improve, and Compliance with prescribed medications will improve  Medication Management: RN will administer medications as ordered by provider, will assess and evaluate patient's response and provide education to patient for prescribed medication. RN will report any adverse  and/or side effects to prescribing provider.  Therapeutic Interventions: 1 on 1 counseling sessions, Psychoeducation, Medication administration, Evaluate responses to treatment, Monitor vital signs and CBGs as ordered, Perform/monitor CIWA, COWS, AIMS and Fall Risk screenings as ordered, Perform wound care treatments as ordered.  Evaluation of Outcomes: Not Met   LCSW Treatment Plan for Primary Diagnosis: Adjustment disorder with mixed anxiety and depressed mood Long Term Goal(s): Safe transition to appropriate next level of care at discharge, Engage patient in therapeutic group addressing interpersonal concerns.  Short Term Goals: Engage patient in aftercare planning with referrals and resources, Increase social support, Increase ability to appropriately verbalize feelings, Increase emotional regulation, Facilitate acceptance of mental health diagnosis  and concerns, and Increase skills for wellness and recovery  Therapeutic Interventions: Assess for all discharge needs, 1 to 1 time with Social worker, Explore available resources and support systems, Assess for adequacy in community support network, Educate family and significant other(s) on suicide prevention, Complete Psychosocial Assessment, Interpersonal group therapy.  Evaluation of Outcomes: Not Met   Progress in Treatment: Attending groups: No. Participating in groups: No. Taking medication as prescribed: Yes. Toleration medication: Yes. Family/Significant other contact made: No, will contact:  pt declined collateral permission. SPE completed with pt.  Patient understands diagnosis: Yes. Discussing patient identified problems/goals with staff: Yes. Medical problems stabilized or resolved: Yes. Denies suicidal/homicidal ideation: Yes. Issues/concerns per patient self-inventory: No. Other: None  New problem(s) identified: No, Describe:  None  New Short Term/Long Term Goal(s): Patient to work towards, medication management for mood stabilization; elimination of SI thoughts; development of comprehensive mental wellness plan.   Patient Goals:  "gonna get home, get a new job and start over."  Discharge Plan or Barriers:  CSW will assist pt development of appropriate discharge/aftercare plan.   Reason for Continuation of Hospitalization: Depression Medication stabilization  Estimated Length of Stay: 1-7 days  Last 3 [place] Suicide Severity Risk Score: [place] Admission (Current) from 04/19/2022 in [facility] Most recent reading at 04/20/2022 12:08 AM ED from 04/19/2022 in [facility] Emergency Department at [facility] Most recent reading at 04/19/2022 10:45 AM ED from 11/22/2020 in [facility] Emergency Department at [facility] Most recent reading at 11/22/2020  2:49 PM  C-SSRS RISK CATEGORY High Risk High Risk No Risk        Last PHQ 2/9 Scores:     No data to display          Scribe for Treatment Team: Pravin Perezperez A [place], [place]ake 04/21/2022 11:22 AM

## 2022-04-21 NOTE — Plan of Care (Signed)

## 2022-04-21 NOTE — Progress Notes (Signed)
D: Patient alert and oriented times 4. Pt denies SI, HI, AVH. Pt denies pain at this time but states his wife is a pain in his ass. Patient continues to talk with pressured speech. No other concerns noted at this time.   A: Pt provided support and encouragement throughout the day. Scheduled medications given as prescribed. Takes medications whole without issue.    R: Pt remains safe on the unit with Q15 min safety checks in place. Will continue to monitor for changes.

## 2022-04-21 NOTE — Progress Notes (Signed)
   04/21/22 1000  Psych Admission Type (Psych Patients Only)  Admission Status Involuntary  Psychosocial Assessment  Patient Complaints None  Eye Contact Fair  Facial Expression Angry;Worried  Affect Angry;Irritable  Catering manager Activity Other (Comment) (WDL)  Appearance/Hygiene In scrubs  Behavior Characteristics Cooperative  Mood Sad;Irritable  Thought Process  Coherency WDL  Content WDL  Delusions None reported or observed  Perception WDL  Hallucination None reported or observed  Judgment Impaired  Confusion None  Danger to Self  Current suicidal ideation? Denies  Danger to Others  Danger to Others None reported or observed

## 2022-04-21 NOTE — Progress Notes (Signed)
[facility] MD Progress Note  04/21/2022 12:13 PM Brendan Caldwell  MRN:  [id number] Subjective: Brendan Caldwell is seen on rounds.  He states that he slept well last night.  He has been taking Remeron at bedtime.  He says that his mood is better.  He denies any suicidal ideation.  He is looking forward to discharge so that he can look for a job.  He has been very pleasant and cooperative and interacts well with staff and peers.  We talked about follow-up in [facility] and discharge on Friday.  Principal Problem: Adjustment disorder with mixed anxiety and depressed mood Diagnosis: Principal Problem:   Adjustment disorder with mixed anxiety and depressed mood  Total Time spent with patient: 15 minutes  Past Psychiatric History: He saw a psychiatrist in [place] after his girlfriend was murdered and took Zoloft for about 2 years.  Past Medical History:  Past Medical History:  Diagnosis Date   Back pain    Hernia, inguinal    Hyperlipidemia     Past Surgical History:  Procedure Laterality Date   FINGER SURGERY     HERNIA REPAIR     Family History:  Family History  Problem Relation Age of Onset   Sudden death Neg Hx    Hypertension Neg Hx    Hyperlipidemia Neg Hx    Heart attack Neg Hx    Diabetes Neg Hx    Family Psychiatric  History: Unremarkable Social History:  Social History   Substance and Sexual Activity  Alcohol Use No     Social History   Substance and Sexual Activity  Drug Use No    Social History   Socioeconomic History   Marital status: Married    Spouse name: Not on file   Number of children: Not on file   Years of education: Not on file   Highest education level: Not on file  Occupational History   Not on file  Tobacco Use   Smoking status: Some Days    Types: Cigarettes   Smokeless tobacco: Never  Vaping Use   Vaping Use: Never used  Substance and Sexual Activity   Alcohol use: No   Drug use: No   Sexual activity: Not on file  Other Topics Concern   Not on  file  Social History Narrative   Not on file   Social Determinants of Health   Financial Resource Strain: Not on file  Food Insecurity: No Food Insecurity (04/20/2022)   Hunger Vital Sign    Worried About Running Out of Food in the Last Year: Never true    Ran Out of Food in the Last Year: Never true  Transportation Needs: No Transportation Needs (04/20/2022)   PRAPARE - Hydrologist (Medical): No    Lack of Transportation (Non-Medical): No  Physical Activity: Not on file  Stress: Not on file  Social Connections: Not on file   Additional Social History:                         Sleep: Good  Appetite:  Good  Current Medications: Current Facility-Administered Medications  Medication Dose Route Frequency Provider Last Rate Last Admin   acetaminophen (TYLENOL) tablet 650 mg  650 mg Oral Q6H PRN Parks Ranger, DO       alum & mag hydroxide-simeth (MAALOX/MYLANTA) 200-200-20 MG/5ML suspension 30 mL  30 mL Oral Q4H PRN Parks Ranger, DO       atorvastatin (  LIPITOR) tablet 20 mg  20 mg Oral Daily Parks Ranger, DO   20 mg at 04/21/22 [id number]   diphenhydrAMINE (BENADRYL) capsule 50 mg  50 mg Oral TID PRN Parks Ranger, DO       Or   diphenhydrAMINE (BENADRYL) injection 50 mg  50 mg Intramuscular TID PRN Parks Ranger, DO       feeding supplement (ENSURE ENLIVE / ENSURE PLUS) liquid 237 mL  237 mL Oral TID BM Parks Ranger, DO   237 mL at 04/21/22 0848   haloperidol (HALDOL) tablet 5 mg  5 mg Oral TID PRN Parks Ranger, DO       Or   haloperidol lactate (HALDOL) injection 5 mg  5 mg Intramuscular TID PRN Parks Ranger, DO       LORazepam (ATIVAN) tablet 2 mg  2 mg Oral TID PRN Parks Ranger, DO       Or   LORazepam (ATIVAN) injection 2 mg  2 mg Intramuscular TID PRN Parks Ranger, DO       LORazepam (ATIVAN) tablet 0.5 mg  0.5 mg Oral BID Parks Ranger, DO   0.5 mg at 04/21/22 [id number]   magnesium hydroxide (MILK OF MAGNESIA) suspension 30 mL  30 mL Oral Daily PRN Parks Ranger, DO       mirtazapine (REMERON SOL-TAB) disintegrating tablet 15 mg  15 mg Oral QHS Parks Ranger, DO   15 mg at 04/20/22 2150   multivitamin with minerals tablet 1 tablet  1 tablet Oral Daily Parks Ranger, DO   1 tablet at 04/21/22 [id number]   nicotine (NICODERM CQ - dosed in mg/24 hours) patch 14 mg  14 mg Transdermal Daily Parks Ranger, DO   14 mg at 04/21/22 [id number]    Lab Results:  Results for orders placed or performed during the hospital encounter of 04/19/22 (from the past 48 hour(s))  Resp panel by RT-PCR (RSV, Flu A&B, Covid) Nasopharyngeal Swab     Status: None   Collection Time: 04/19/22  4:31 PM   Specimen: Nasopharyngeal Swab; Nasal Swab  Result Value Ref Range   SARS Coronavirus 2 by RT PCR NEGATIVE NEGATIVE    Comment: (NOTE) SARS-CoV-2 target nucleic acids are NOT DETECTED.  The SARS-CoV-2 RNA is generally detectable in upper respiratory specimens during the acute phase of infection. The lowest concentration of SARS-CoV-2 viral copies this assay can detect is 138 copies/mL. A negative result does not preclude SARS-Cov-2 infection and should not be used as the sole basis for treatment or other patient management decisions. A negative result may occur with  improper specimen collection/handling, submission of specimen other than nasopharyngeal swab, presence of viral mutation(s) within the areas targeted by this assay, and inadequate number of viral copies(<138 copies/mL). A negative result must be combined with clinical observations, patient history, and epidemiological information. The expected result is Negative.  Fact Sheet for Patients:  EntrepreneurPulse.com.au  Fact Sheet for Healthcare Providers:  IncredibleEmployment.be  This test is no t yet approved or  cleared by the [place] FDA and  has been authorized for detection and/or diagnosis of SARS-CoV-2 by FDA under an Emergency Use Authorization (EUA). This EUA will remain  in effect (meaning this test can be used) for the duration of the COVID-19 declaration under Section 564(b)(1) of the Act, 21 U.S.C.section 360bbb-3(b)(1), unless the authorization is terminated  or revoked sooner.       Influenza A by PCR  NEGATIVE NEGATIVE   Influenza B by PCR NEGATIVE NEGATIVE    Comment: (NOTE) The Xpert Xpress SARS-CoV-2/FLU/RSV plus assay is intended as an aid in the diagnosis of influenza from Nasopharyngeal swab specimens and should not be used as a sole basis for treatment. Nasal washings and aspirates are unacceptable for Xpert Xpress SARS-CoV-2/FLU/RSV testing.  Fact Sheet for Patients: EntrepreneurPulse.com.au  Fact Sheet for Healthcare Providers: IncredibleEmployment.be  This test is not yet approved or cleared by the [place] FDA and has been authorized for detection and/or diagnosis of SARS-CoV-2 by FDA under an Emergency Use Authorization (EUA). This EUA will remain in effect (meaning this test can be used) for the duration of the COVID-19 declaration under Section 564(b)(1) of the Act, 21 U.S.C. section 360bbb-3(b)(1), unless the authorization is terminated or revoked.     Resp Syncytial Virus by PCR NEGATIVE NEGATIVE    Comment: (NOTE) Fact Sheet for Patients: EntrepreneurPulse.com.au  Fact Sheet for Healthcare Providers: IncredibleEmployment.be  This test is not yet approved or cleared by the [place] FDA and has been authorized for detection and/or diagnosis of SARS-CoV-2 by FDA under an Emergency Use Authorization (EUA). This EUA will remain in effect (meaning this test can be used) for the duration of the COVID-19 declaration under Section 564(b)(1) of the Act, 21 U.S.C. section  360bbb-3(b)(1), unless the authorization is terminated or revoked.  Performed at [facility], [address] [place], [place] [address]     Blood Alcohol level:  Lab Results  Component Value Date   [facility] <10 [id number]    Metabolic Disorder Labs: No results found for: "HGBA1C", "MPG" No results found for: "PROLACTIN" No results found for: "CHOL", "TRIG", "HDL", "CHOLHDL", "VLDL", "LDLCALC"  Physical Findings: AIMS:  , ,  ,  ,    CIWA:    COWS:     Musculoskeletal: Strength & Muscle Tone: within normal limits Gait & Station: normal Patient leans: N/A  Psychiatric Specialty Exam:  Presentation  General Appearance:  Fairly Groomed  Eye Contact: Good  Speech: Pressured  Speech Volume: Increased  Handedness: Right   Mood and Affect  Mood: Anxious; Depressed; Dysphoric; Labile  Affect: Congruent; Depressed; Labile   Thought Process  Thought Processes: Goal Directed  Descriptions of Associations:Circumstantial  Orientation:Full (Time, Place and Person)  Thought Content:Illogical; Rumination (in that he wants to end his life; ruminates over his losses/perceived losses)  History of Schizophrenia/Schizoaffective disorder:No data recorded Duration of Psychotic Symptoms:No data recorded Hallucinations:No data recorded Ideas of Reference:None  Suicidal Thoughts:No data recorded Homicidal Thoughts:No data recorded  Sensorium  Memory: Immediate Good; Recent Good; Remote Good  Judgment: Fair  Insight: Fair   Community education officer  Concentration: Fair  Attention Span: Fair  Recall: [place] of Knowledge: Good  Language: Good   Psychomotor Activity  Psychomotor Activity:No data recorded  Assets  Assets: Communication Skills; Desire for Improvement; Housing; Catering manager; Social Support   Sleep  Sleep:No data recorded   Physical Exam: Physical Exam Vitals and nursing note reviewed.   Constitutional:      Appearance: Normal appearance. He is normal weight.  Neurological:     General: No focal deficit present.     Mental Status: He is alert and oriented to person, place, and time.  Psychiatric:        Attention and Perception: Attention and perception normal.        Mood and Affect: Mood and affect normal.        Speech: Speech  normal.        Behavior: Behavior normal. Behavior is cooperative.        Thought Content: Thought content normal.        Cognition and Memory: Cognition and memory normal.        Judgment: Judgment normal.    Review of Systems  Constitutional: Negative.   HENT: Negative.    Eyes: Negative.   Respiratory: Negative.    Cardiovascular: Negative.   Gastrointestinal: Negative.   Genitourinary: Negative.   Musculoskeletal: Negative.   Skin: Negative.   Neurological: Negative.   Endo/Heme/Allergies: Negative.   Psychiatric/Behavioral:  Positive for depression.    Blood pressure (!) 124/103, pulse 84, temperature 97.8 F (36.6 C), temperature source Oral, resp. rate 18, height 5\' 6"  (1.676 m), weight 61.5 kg, SpO2 98 %. Body mass index is 21.87 kg/m.   Treatment Plan Summary: Daily contact with patient to assess and evaluate symptoms and progress in treatment, Medication management, and Plan continue current medications.  Parks Ranger, DO 04/21/2022, 12:13 PM

## 2022-04-21 NOTE — Group Note (Signed)
Recreation Therapy Group Note   Group Topic:General Recreation  Group Date: 04/21/2022 Start Time: 1400 End Time: 1450 Facilitators: Vilma Prader, LRT, CTRS Location: Dayroom  Group Description: Rummy. LRT and patients played the card game "Rummy" with music playing in the background. LRT and pts discussed how they have or have not ever played card games, where they use to play, and who they use to play with. LRT an pts discussed how this could be a leisure interest post-discharge.   Affect/Mood: Appropriate   Participation Level: Active and Engaged   Participation Quality: Independent   Behavior: Appropriate and Calm   Speech/Thought Process: Coherent   Insight: Good   Judgement: Good   Modes of Intervention: Competitive Play   Patient Response to Interventions:  Attentive, Engaged, Interested , and Receptive   Education Outcome:  Acknowledges education   Clinical Observations/Individualized Feedback: Coree was active in their participation of session activities and group discussion. Pt shared "its been over 100 years since I have played cards!". Pt was seen helping a peer who was just learning how to play the game. Pt shared that he was very tired from the medication and that tomorrow he hopes to be more awake around this time.    Plan: Continue to engage patient in RT group sessions 2-3x/week.   Vilma Prader, LRT, [place] 04/21/2022 2:59 PM

## 2022-04-22 DIAGNOSIS — F4323 Adjustment disorder with mixed anxiety and depressed mood: Secondary | ICD-10-CM | POA: Diagnosis not present

## 2022-04-22 MED ORDER — BUPROPION HCL ER (XL) 150 MG PO TB24
150.0000 mg | ORAL_TABLET | Freq: Every day | ORAL | Status: DC
  Administered 2022-04-22 – 2022-04-23 (×2): 150 mg via ORAL
  Filled 2022-04-22 (×2): qty 1
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
## 2022-04-22 NOTE — Group Note (Signed)
Date:  04/22/2022 Time:  3:20 PM  Group Topic/Focus:  Mediation/meditation, gratitude group     Participation Level:  Active  Participation Quality:  Appropriate and Attentive  Affect:  Appropriate  Cognitive:  Alert and Appropriate  Insight: Appropriate and Good  Engagement in Group:  Engaged  Modes of Intervention:  Activity and Discussion  Additional Comments:    Ladona Mow 04/22/2022, 3:20 PM

## 2022-04-22 NOTE — Progress Notes (Signed)
Patient pleasant and cooperative.  Denies SI/HI and AVH.  Denies feelings of anxiety and depression.  Patient states he is ready to go home and start his new life.  Denies pain.  Compliant with scheduled medications.  15 min checks in place for patient safety.  Attended MHT, SW and RT group.  Active participation.  Appropriate interaction with peers.

## 2022-04-22 NOTE — BHH Suicide Risk Assessment (Addendum)
[facility] INPATIENT:  Family/Significant Other Suicide Prevention Education  Suicide Prevention Education:   Patient Refusal for Family/Significant Other Suicide Prevention Education: The patient Brendan Caldwell has refused to provide written consent for family/significant other to be provided Family/Significant Other Suicide Prevention Education during admission and/or prior to discharge.  Physician notified.  SPE completed with pt, as pt refused to consent to family contact. SPI pamphlet provided to pt and pt was encouraged to share information with support network, ask questions, and talk about any concerns relating to SPE. Pt denies access to guns/firearms and verbalized understanding of information provided. Mobile Crisis information also provided to pt.   Brendan Caldwell 04/22/2022, 11:11 AM

## 2022-04-22 NOTE — Group Note (Signed)
[facility] LCSW Group Therapy Note   Group Date: 04/22/2022 Start Time: 1300 End Time: 1400   Type of Therapy/Topic:  Group Therapy:  Balance in Life  Participation Level:  Active   Description of Group:    This group will address the concept of balance and how it feels and looks when one is unbalanced. Patients will be encouraged to process areas in their lives that are out of balance, and identify reasons for remaining unbalanced. Facilitators will guide patients utilizing problem- solving interventions to address and correct the stressor making their life unbalanced. Understanding and applying boundaries will be explored and addressed for obtaining  and maintaining a balanced life. Patients will be encouraged to explore ways to assertively make their unbalanced needs known to significant others in their lives, using other group members and facilitator for support and feedback.  Therapeutic Goals: Patient will identify two or more emotions or situations they have that consume much of in their lives. Patient will identify signs/triggers that life has become out of balance:  Patient will identify two ways to set boundaries in order to achieve balance in their lives:  Patient will demonstrate ability to communicate their needs through discussion and/or role plays  Summary of Patient Progress:    Patient was present for the entirety of the group session. Patient was an active listener and participated in the topic of discussion, provided helpful advice to others, and added nuance to topic of conversation. He stated he was looking forward to starting over because "I haven't gone to church in 12 years because my wife wouldn't let me." He stated he is looking forward to getting his life back.     Therapeutic Modalities:   Cognitive Behavioral Therapy Solution-Focused Therapy Assertiveness Training   [place] A [place], LCSWA

## 2022-04-22 NOTE — Progress Notes (Signed)
[facility] MD Progress Note  04/22/2022 12:29 PM Brendan Caldwell  MRN:  [id number] Subjective: Brendan Caldwell is seen on rounds.  He says that he slept well last night.  He is tolerating the medications without any side effects.  He says that he was prescribed Chantix prior to hospitalization.  I told him that Wellbutrin can help with depression and smoking cessation.  He is willing to give that a try.  I told him Chantix sometimes causes a lot of mood problems whereas Wellbutrin does not.  We discussed discharge tomorrow.  Principal Problem: Adjustment disorder with mixed anxiety and depressed mood Diagnosis: Principal Problem:   Adjustment disorder with mixed anxiety and depressed mood  Total Time spent with patient: 15 minutes  Past Psychiatric History: He saw a psychiatrist in [place] after his girlfriend was murdered and took Zoloft for about 2 years.   Past Medical History:  Past Medical History:  Diagnosis Date   Back pain    Hernia, inguinal    Hyperlipidemia     Past Surgical History:  Procedure Laterality Date   FINGER SURGERY     HERNIA REPAIR     Family History:  Family History  Problem Relation Age of Onset   Sudden death Neg Hx    Hypertension Neg Hx    Hyperlipidemia Neg Hx    Heart attack Neg Hx    Diabetes Neg Hx    Family Psychiatric  History: Unremarkable Social History:  Social History   Substance and Sexual Activity  Alcohol Use No     Social History   Substance and Sexual Activity  Drug Use No    Social History   Socioeconomic History   Marital status: Married    Spouse name: Not on file   Number of children: Not on file   Years of education: Not on file   Highest education level: Not on file  Occupational History   Not on file  Tobacco Use   Smoking status: Some Days    Types: Cigarettes   Smokeless tobacco: Never  Vaping Use   Vaping Use: Never used  Substance and Sexual Activity   Alcohol use: No   Drug use: No   Sexual activity: Not on file   Other Topics Concern   Not on file  Social History Narrative   Not on file   Social Determinants of Health   Financial Resource Strain: Not on file  Food Insecurity: No Food Insecurity (04/20/2022)   Hunger Vital Sign    Worried About Running Out of Food in the Last Year: Never true    Ran Out of Food in the Last Year: Never true  Transportation Needs: No Transportation Needs (04/20/2022)   PRAPARE - Hydrologist (Medical): No    Lack of Transportation (Non-Medical): No  Physical Activity: Not on file  Stress: Not on file  Social Connections: Not on file   Additional Social History:                         Sleep: Good  Appetite:  Good  Current Medications: Current Facility-Administered Medications  Medication Dose Route Frequency Provider Last Rate Last Admin   acetaminophen (TYLENOL) tablet 650 mg  650 mg Oral Q6H PRN Parks Ranger, DO       alum & mag hydroxide-simeth (MAALOX/MYLANTA) 200-200-20 MG/5ML suspension 30 mL  30 mL Oral Q4H PRN Parks Ranger, DO  atorvastatin (LIPITOR) tablet 20 mg  20 mg Oral Daily Parks Ranger, DO   20 mg at 04/22/22 0913   buPROPion (WELLBUTRIN XL) 24 hr tablet 150 mg  150 mg Oral Daily Parks Ranger, DO       diphenhydrAMINE (BENADRYL) capsule 50 mg  50 mg Oral TID PRN Parks Ranger, DO       Or   diphenhydrAMINE (BENADRYL) injection 50 mg  50 mg Intramuscular TID PRN Parks Ranger, DO       feeding supplement (ENSURE ENLIVE / ENSURE PLUS) liquid 237 mL  237 mL Oral TID BM Parks Ranger, DO   237 mL at 04/22/22 0912   haloperidol (HALDOL) tablet 5 mg  5 mg Oral TID PRN Parks Ranger, DO       Or   haloperidol lactate (HALDOL) injection 5 mg  5 mg Intramuscular TID PRN Parks Ranger, DO       LORazepam (ATIVAN) tablet 2 mg  2 mg Oral TID PRN Parks Ranger, DO       Or   LORazepam (ATIVAN) injection 2  mg  2 mg Intramuscular TID PRN Parks Ranger, DO       LORazepam (ATIVAN) tablet 0.5 mg  0.5 mg Oral BID Parks Ranger, DO   0.5 mg at 04/22/22 0913   magnesium hydroxide (MILK OF MAGNESIA) suspension 30 mL  30 mL Oral Daily PRN Parks Ranger, DO       mirtazapine (REMERON SOL-TAB) disintegrating tablet 15 mg  15 mg Oral QHS Parks Ranger, DO   15 mg at 04/21/22 2146   multivitamin with minerals tablet 1 tablet  1 tablet Oral Daily Parks Ranger, DO   1 tablet at 04/22/22 0913   nicotine (NICODERM CQ - dosed in mg/24 hours) patch 14 mg  14 mg Transdermal Daily Parks Ranger, DO   14 mg at 04/22/22 [id number]    Lab Results: No results found for this or any previous visit (from the past 53 hour(s)).  Blood Alcohol level:  Lab Results  Component Value Date   ETH <10 [id number]    Metabolic Disorder Labs: No results found for: "HGBA1C", "MPG" No results found for: "PROLACTIN" No results found for: "CHOL", "TRIG", "HDL", "CHOLHDL", "VLDL", "LDLCALC"  Physical Findings: AIMS:  , ,  ,  ,    CIWA:    COWS:     Musculoskeletal: Strength & Muscle Tone: within normal limits Gait & Station: normal Patient leans: Backward  Psychiatric Specialty Exam:  Presentation  General Appearance:  Fairly Groomed  Eye Contact: Good  Speech: Pressured  Speech Volume: Increased  Handedness: Right   Mood and Affect  Mood: Anxious; Depressed; Dysphoric; Labile  Affect: Congruent; Depressed; Labile   Thought Process  Thought Processes: Goal Directed  Descriptions of Associations:Circumstantial  Orientation:Full (Time, Place and Person)  Thought Content:Illogical; Rumination (in that he wants to end his life; ruminates over his losses/perceived losses)  History of Schizophrenia/Schizoaffective disorder:No data recorded Duration of Psychotic Symptoms:No data recorded Hallucinations:No data recorded Ideas of  Reference:None  Suicidal Thoughts:No data recorded Homicidal Thoughts:No data recorded  Sensorium  Memory: Immediate Good; Recent Good; Remote Good  Judgment: Fair  Insight: Fair   Community education officer  Concentration: Fair  Attention Span: Fair  Recall: [place] of Knowledge: Good  Language: Good   Psychomotor Activity  Psychomotor Activity:No data recorded  Assets  Assets: Communication Skills; Desire for Improvement; Housing; Museum/gallery curator  Resources/Insurance; Social Support   Sleep  Sleep:No data recorded   Physical Exam: Physical Exam Vitals and nursing note reviewed.  Constitutional:      Appearance: Normal appearance. He is normal weight.  Neurological:     General: No focal deficit present.     Mental Status: He is alert and oriented to person, place, and time.  Psychiatric:        Attention and Perception: Attention and perception normal.        Mood and Affect: Mood is depressed.        Speech: Speech normal.        Behavior: Behavior normal. Behavior is cooperative.    Review of Systems  Constitutional: Negative.   HENT: Negative.    Eyes: Negative.   Respiratory: Negative.    Cardiovascular: Negative.   Gastrointestinal: Negative.   Genitourinary: Negative.   Musculoskeletal: Negative.   Skin: Negative.   Neurological: Negative.   Endo/Heme/Allergies: Negative.   Psychiatric/Behavioral: Negative.     Blood pressure (!) 121/53, pulse 76, temperature 99.1 F (37.3 C), temperature source Oral, resp. rate 18, height 5\' 6"  (1.676 m), weight 61.5 kg, SpO2 100 %. Body mass index is 21.87 kg/m.   Treatment Plan Summary: Daily contact with patient to assess and evaluate symptoms and progress in treatment, Medication management, and Plan start Wellbutrin XL 150 mg every morning.  Parks Ranger, DO 04/22/2022, 12:29 PM

## 2022-04-22 NOTE — Plan of Care (Signed)
  Problem: Nutrition: Goal: Adequate nutrition will be maintained Outcome: Progressing   Problem: Coping: Goal: Level of anxiety will decrease Outcome: Progressing   Problem: Pain Managment: Goal: General experience of comfort will improve Outcome: Progressing   Problem: Safety: Goal: Ability to remain free from injury will improve Outcome: Progressing   

## 2022-04-23 ENCOUNTER — Other Ambulatory Visit (HOSPITAL_BASED_OUTPATIENT_CLINIC_OR_DEPARTMENT_OTHER): Payer: Self-pay

## 2022-04-23 DIAGNOSIS — F4323 Adjustment disorder with mixed anxiety and depressed mood: Secondary | ICD-10-CM | POA: Diagnosis not present

## 2022-04-23 MED ORDER — MIRTAZAPINE 15 MG PO TABS: 15.0000 mg | ORAL_TABLET | Freq: Every day | ORAL | Status: DC

## 2022-04-23 MED ORDER — BUPROPION HCL ER (XL) 150 MG PO TB24
150.0000 mg | ORAL_TABLET | Freq: Every day | ORAL | 3 refills | Status: AC
  Filled 2022-04-23: qty 30, 30d supply, fill #0

## 2022-04-23 MED ORDER — MIRTAZAPINE 15 MG PO TABS
15.0000 mg | ORAL_TABLET | Freq: Every day | ORAL | 3 refills | Status: AC
  Filled 2022-04-23: qty 30, 30d supply, fill #0

## 2022-04-23 NOTE — BHH Suicide Risk Assessment (Signed)
[facility] Discharge Suicide Risk Assessment   Principal Problem: Adjustment disorder with mixed anxiety and depressed mood Discharge Diagnoses: Principal Problem:   Adjustment disorder with mixed anxiety and depressed mood   Total Time spent with patient: 45 MINUTES  Musculoskeletal: Strength & Muscle Tone: within normal limits Gait & Station: normal Patient leans: N/A  Psychiatric Specialty Exam  Presentation  General Appearance:  Fairly Groomed  Eye Contact: Good  Speech: Pressured  Speech Volume: Increased  Handedness: Right   Mood and Affect  Mood: Anxious; Depressed; Dysphoric; Labile  Duration of Depression Symptoms: No data recorded Affect: Congruent; Depressed; Labile   Thought Process  Thought Processes: Goal Directed  Descriptions of Associations:Circumstantial  Orientation:Full (Time, Place and Person)  Thought Content:Illogical; Rumination (in that he wants to end his life; ruminates over his losses/perceived losses)  History of Schizophrenia/Schizoaffective disorder:No data recorded Duration of Psychotic Symptoms:No data recorded Hallucinations:No data recorded Ideas of Reference:None  Suicidal Thoughts:No data recorded Homicidal Thoughts:No data recorded  Sensorium  Memory: Immediate Good; Recent Good; Remote Good  Judgment: Fair  Insight: Fair   Community education officer  Concentration: Fair  Attention Span: Fair  Recall: [place] of Knowledge: Good  Language: Good   Psychomotor Activity  Psychomotor Activity:No data recorded  Assets  Assets: Communication Skills; Desire for Improvement; Housing; Catering manager; Social Support   Sleep  Sleep:No data recorded  Physical Exam: Physical Exam ROS Blood pressure (!) 121/53, pulse 76, temperature 99.1 F (37.3 C), temperature source Oral, resp. rate 18, height 5\' 6"  (1.676 m), weight 61.5 kg, SpO2 100 %. Body mass index is 21.87 kg/m.  Mental  Status Per Nursing Assessment::   On Admission:  NA  Demographic Factors:  Male, Age 70 or older, and Caucasian  Loss Factors: Loss of significant relationship  Historical Factors: NA  Risk Reduction Factors:   Living with another person, especially a relative, Positive social support, Positive therapeutic relationship, and Positive coping skills or problem solving skills    Cognitive Features That Contribute To Risk:  None    Suicide Risk:  Minimal: No identifiable suicidal ideation.  Patients presenting with no risk factors but with morbid ruminations; may be classified as minimal risk based on the severity of the depressive symptoms   Follow-up Information     Ardyth Gal, [place] Follow up.   Specialty: Psychology Why: Updated name and address: [place]. [address] [place], [place] [address].  A referral has been sent to your provider. The office will reach out on Friday March 22nd at 3pm to contact you regarding scheduling follow up appointment. Please contact their office Monday March 25th at 9am if you do not hear from them. Contact information: [address] [place] [address] [phone]                 Plan Of Care/Follow-up recommendations:  [place] in [facility], [place] 04/23/2022, 10:38 AM

## 2022-04-23 NOTE — Progress Notes (Signed)
D: Pt alert and oriented. Pt denies experiencing any pain, SI/HI, or AVH at this time. Pt reports he will be able to keep himself safe when he returns home. Pt has completed a suicide safety plan and was given a survey to fill out. Transition record was reviewed and given to pt. Pt does not wish to receive a follow up call from the manager.   A: Pt received discharge and medication education/information. Pt belongings were returned and signed for at this time.   R: Pt verbalized understanding of discharge and medication education/information.  Pt escorted by staff to medical mall front lobby where pt was picked up by [employer].

## 2022-04-23 NOTE — Care Management Important Message (Signed)
Important Message  Patient Details  Name: Rufe Rutkowski MRN: [id number] Date of Birth: [birth year]   Medicare Important Message Given:  Yes     Mcgwire Dasaro A [place], [place] 04/23/2022, 9:10 AM

## 2022-04-23 NOTE — Plan of Care (Signed)
Pt denies anxiety/depression at this time. Pt denies SI/HI/AVH or pain at this time. Pt is calm and cooperative. Pt is medication compliant. Pt provided with support and encouragement. Pt monitored q15 minutes for safety per unit policy. Plan of care ongoing.   Problem: Education: Goal: Knowledge of General Education information will improve Description: Including pain rating scale, medication(s)/side effects and non-pharmacologic comfort measures Outcome: Progressing   Problem: Nutrition: Goal: Adequate nutrition will be maintained Outcome: Progressing   Problem: Coping: Goal: Level of anxiety will decrease Outcome: Progressing
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
## 2022-04-23 NOTE — Progress Notes (Signed)
  [facility] Adult Case Management Discharge Plan :  Will you be returning to the same living situation after discharge:  Yes,  pt will be returning home At discharge, do you have transportation home?: Yes,  CSW will assist pt with obtaining transportation Do you have the ability to pay for your medications: Yes,  pt has Medicare Part A&B  Release of information consent forms completed and in the chart;  Patient's signature needed at discharge.  Patient to Follow up at:  Follow-up Information     [facility], [place] Follow up.   Specialty: Psychology Why: Updated name and address: [place]. [address] [place], [place] [address].  A referral has been sent to your provider. The office will reach out on Friday March 22nd at 3pm to contact you regarding scheduling follow up appointment. Please contact their office Monday March 25th at 9am if you do not hear from them. Contact information: [address] [place] [address] [phone]                 Next level of care provider has access to [place] and Suicide Prevention discussed: Yes,  SPE completed with pt     Has patient been referred to the Quitline?: Patient refused referral  Patient has been referred for addiction treatment: N/A  Braydyn Schultes A [place], [place] 04/23/2022, 9:32 AM

## 2022-04-23 NOTE — BH Assessment (Signed)
1910 Patient received via report that he is resting quietly in bed with eyes closed. Will continue to monitor patient for safety.  1955 Patient enters the day room for snack time, he spends a small moment  of time and returns to his room. His gate is steady and moderately strong. He is on fall risk precaution and he is being observed.   2145 Patient is alert and oriented  x 3, he denies SI/HI, A/V hallucinations. His affect is flat, but  he does engage in conversations.   2245 Patient is medication compliant and received medication teaching .  0300 Patient remains asleep in bed without any issues. He remains safe while ambulating will continue to monitor patient for safety.  [id number] No safety issues with ambulating. Continues to rest quietly in bed.

## 2022-07-06 ENCOUNTER — Other Ambulatory Visit (HOSPITAL_BASED_OUTPATIENT_CLINIC_OR_DEPARTMENT_OTHER): Payer: Self-pay

## 2022-07-06 MED ORDER — BENZONATATE 100 MG PO CAPS
100.0000 mg | ORAL_CAPSULE | Freq: Three times a day (TID) | ORAL | 0 refills | Status: AC | PRN
  Filled 2022-07-06: qty 20, 7d supply, fill #0

## 2022-07-09 ENCOUNTER — Emergency Department (HOSPITAL_BASED_OUTPATIENT_CLINIC_OR_DEPARTMENT_OTHER)
Admission: EM | Admit: 2022-07-09 | Discharge: 2022-07-09 | Disposition: A | Discharge status: Home / Self Care | Payer: Medicare Other | Attending: Emergency Medicine | Admitting: Emergency Medicine

## 2022-07-09 ENCOUNTER — Encounter (HOSPITAL_BASED_OUTPATIENT_CLINIC_OR_DEPARTMENT_OTHER): Payer: Self-pay

## 2022-07-09 ENCOUNTER — Other Ambulatory Visit: Payer: Self-pay

## 2022-07-09 ENCOUNTER — Other Ambulatory Visit (HOSPITAL_BASED_OUTPATIENT_CLINIC_OR_DEPARTMENT_OTHER): Payer: Self-pay

## 2022-07-09 DIAGNOSIS — K029 Dental caries, unspecified: Secondary | ICD-10-CM | POA: Diagnosis not present

## 2022-07-09 DIAGNOSIS — K0889 Other specified disorders of teeth and supporting structures: Secondary | ICD-10-CM

## 2022-07-09 MED ORDER — NAPROXEN 250 MG PO TABS
500.0000 mg | ORAL_TABLET | Freq: Once | ORAL | Status: AC
  Administered 2022-07-09: 500 mg via ORAL
  Filled 2022-07-09: qty 2

## 2022-07-09 MED ORDER — PENICILLIN V POTASSIUM 500 MG PO TABS
500.0000 mg | ORAL_TABLET | Freq: Four times a day (QID) | ORAL | 0 refills | Status: AC
  Filled 2022-07-09: qty 28, 7d supply, fill #0

## 2022-07-09 MED ORDER — PENICILLIN V POTASSIUM 250 MG PO TABS
500.0000 mg | ORAL_TABLET | Freq: Once | ORAL | Status: AC
  Administered 2022-07-09: 500 mg via ORAL
  Filled 2022-07-09: qty 2

## 2022-07-09 MED ORDER — LIDOCAINE VISCOUS HCL 2 % MT SOLN
15.0000 mL | Freq: Once | OROMUCOSAL | Status: AC
  Administered 2022-07-09: 15 mL via OROMUCOSAL
  Filled 2022-07-09: qty 15

## 2022-07-09 NOTE — ED Triage Notes (Signed)
Complaining of pain in the lower rt side of his mouth. Started yesterday.

## 2022-07-09 NOTE — ED Provider Notes (Signed)
[facility] Provider Note   CSN: [id number] Arrival date & time: 07/09/22  0152     History  Chief Complaint  Patient presents with   Dental Pain    Brendan Caldwell is a 72 y.o. male.  The history is provided by the patient.  Dental Pain Location:  Lower Lower teeth location:  23/LL lateral incisor Quality:  Aching Severity:  Severe Onset quality:  Sudden Duration:  1 day Timing:  Constant Progression:  Worsening Chronicity:  Recurrent Context: poor dentition   Previous work-up:  Dental exam Relieved by:  Nothing Worsened by:  Nothing Ineffective treatments:  None tried Associated symptoms: no congestion, no difficulty swallowing, no drooling, no facial pain and no fever   Risk factors: no diabetes        Home Medications Prior to Admission medications   Medication Sig Start Date End Date Taking? Authorizing Provider  penicillin v potassium (VEETID) 500 MG tablet Take 1 tablet (500 mg total) by mouth 4 (four) times daily for 7 days. 07/09/22 07/16/22 Yes Loman Logan, MD  atorvastatin (LIPITOR) 20 MG tablet TAKE 1 TABLET BY MOUTH ONCE DAILY 09/19/19 09/18/20  Andreas Blower., MD  benzonatate (TESSALON) 100 MG capsule Take 1 capsule (100 mg total) by mouth 3 (three) times daily as needed for cough. 07/09/18   Loren Racer, MD  benzonatate (TESSALON) 100 MG capsule Take 1 capsule (100 mg total) by mouth 3 (three) times daily as needed for cough for up to 7 days. 07/06/22     buPROPion (WELLBUTRIN XL) 150 MG 24 hr tablet Take 1 tablet (150 mg total) by mouth daily. 04/24/22   Sarina Ill, DO  cephALEXin (KEFLEX) 500 MG capsule Take 1 capsule (500 mg total) by mouth 3 (three) times daily. 06/25/16   Loren Racer, MD  clindamycin (CLEOCIN) 300 MG capsule Take 1 capsule (300 mg total) by mouth 4 (four) times daily. X 7 days 06/11/18   Geoffery Lyons, MD  HYDROcodone-acetaminophen (NORCO) 5-325 MG tablet Take 1-2 tablets  by mouth every 6 (six) hours as needed. 06/11/18   Geoffery Lyons, MD  mirtazapine (REMERON) 15 MG tablet Take 1 tablet (15 mg total) by mouth at bedtime. 04/23/22   Sarina Ill, DO  penicillin v potassium (VEETID) 500 MG tablet Take 1 tablet (500 mg total) by mouth 3 (three) times daily for 7 days 12/28/21     permethrin (ELIMITE) 5 % cream Apply to affected area once 06/25/16   Loren Racer, MD      Allergies    Patient has no known allergies.    Review of Systems   Review of Systems  Constitutional:  Negative for fever.  HENT:  Positive for dental problem. Negative for congestion and drooling.   Eyes:  Negative for redness.  Respiratory:  Negative for wheezing and stridor.   Cardiovascular:  Negative for chest pain.  Gastrointestinal:  Negative for abdominal pain.  All other systems reviewed and are negative.   Physical Exam Updated Vital Signs BP (!) 164/59 (BP Location: Left Arm)   Pulse 82   Temp 97.8 F (36.6 C) (Oral)   Resp 18   Ht 5\' 7"  (1.702 m)   Wt 64.4 kg   SpO2 97%   BMI 22.24 kg/m  Physical Exam Vitals and nursing note reviewed.  Constitutional:      General: He is not in acute distress.    Appearance: Normal appearance. He is well-developed. He is not  diaphoretic.  HENT:     Head: Normocephalic and atraumatic.     Nose: Nose normal.     Mouth/Throat:     Comments: Dental caries and tartar  Eyes:     Conjunctiva/sclera: Conjunctivae normal.     Pupils: Pupils are equal, round, and reactive to light.  Cardiovascular:     Rate and Rhythm: Normal rate and regular rhythm.  Pulmonary:     Effort: Pulmonary effort is normal.     Breath sounds: Normal breath sounds. No wheezing or rales.  Abdominal:     General: Bowel sounds are normal.     Palpations: Abdomen is soft.     Tenderness: There is no abdominal tenderness. There is no guarding or rebound.  Musculoskeletal:        General: Normal range of motion.     Cervical back: Normal range  of motion and neck supple.  Skin:    General: Skin is warm and dry.     Capillary Refill: Capillary refill takes less than 2 seconds.  Neurological:     General: No focal deficit present.     Mental Status: He is alert and oriented to person, place, and time.  Psychiatric:        Mood and Affect: Mood normal.        Behavior: Behavior normal.     ED Results / Procedures / Treatments   Labs (all labs ordered are listed, but only abnormal results are displayed) Labs Reviewed - No data to display  EKG None  Radiology No results found.  Procedures Procedures    Medications Ordered in ED Medications  penicillin v potassium (VEETID) tablet 500 mg (has no administration in time range)  lidocaine (XYLOCAINE) 2 % viscous mouth solution 15 mL (has no administration in time range)  naproxen (NAPROSYN) tablet 500 mg (has no administration in time range)    ED Course/ Medical Decision Making/ A&P                             Medical Decision Making Dental pains started yesterdays   Amount and/or Complexity of Data Reviewed External Data Reviewed: notes.    Details: Previous notes reviewed   Risk Prescription drug management. Risk Details: Dental caries will start antibiotics, patient to follow up with dentistry for ongoing care.      Final Clinical Impression(s) / ED Diagnoses Final diagnoses:  Pain, dental  Pain due to dental caries  Return for intractable cough, coughing up blood, fevers > 100.4 unrelieved by medication, shortness of breath, intractable vomiting, chest pain, shortness of breath, weakness, numbness, changes in speech, facial asymmetry, abdominal pain, passing out, Inability to tolerate liquids or food, cough, altered mental status or any concerns. No signs of systemic illness or infection. The patient is nontoxic-appearing on exam and vital signs are within normal limits.  I have reviewed the triage vital signs and the nursing notes. Pertinent labs &  imaging results that were available during my care of the patient were reviewed by me and considered in my medical decision making (see chart for details). After history, exam, and medical workup I feel the patient has been appropriately medically screened and is safe for discharge home. Pertinent diagnoses were discussed with the patient. Patient was given return precautions  Rx / DC Orders ED Discharge Orders          Ordered    penicillin v potassium (VEETID) 500 MG tablet  4 times daily        07/09/22 0203              Charles Niese, MD 07/09/22 [id number]

## 2022-07-11 ENCOUNTER — Encounter (HOSPITAL_BASED_OUTPATIENT_CLINIC_OR_DEPARTMENT_OTHER): Payer: Self-pay

## 2022-07-11 ENCOUNTER — Emergency Department (HOSPITAL_BASED_OUTPATIENT_CLINIC_OR_DEPARTMENT_OTHER)
Admission: EM | Admit: 2022-07-11 | Discharge: 2022-07-11 | Disposition: A | Discharge status: Home / Self Care | Payer: Medicare Other | Attending: Emergency Medicine | Admitting: Emergency Medicine

## 2022-07-11 ENCOUNTER — Other Ambulatory Visit: Payer: Self-pay

## 2022-07-11 DIAGNOSIS — K0889 Other specified disorders of teeth and supporting structures: Secondary | ICD-10-CM | POA: Diagnosis present

## 2022-07-11 DIAGNOSIS — K047 Periapical abscess without sinus: Secondary | ICD-10-CM | POA: Insufficient documentation

## 2022-07-11 MED ORDER — OXYCODONE-ACETAMINOPHEN 5-325 MG PO TABS: 1.0000 | ORAL_TABLET | Freq: Four times a day (QID) | ORAL | 0 refills | Status: AC | PRN

## 2022-07-11 MED ORDER — OXYCODONE-ACETAMINOPHEN 5-325 MG PO TABS
1.0000 | ORAL_TABLET | Freq: Once | ORAL | Status: AC
  Administered 2022-07-11: 1 via ORAL
  Filled 2022-07-11: qty 1

## 2022-07-11 MED ORDER — CLINDAMYCIN HCL 150 MG PO CAPS: 450.0000 mg | ORAL_CAPSULE | Freq: Three times a day (TID) | ORAL | 0 refills | Status: AC

## 2022-07-11 MED ORDER — LIDOCAINE-EPINEPHRINE (PF) 2 %-1:200000 IJ SOLN
10.0000 mL | Freq: Once | INTRAMUSCULAR | Status: AC
  Administered 2022-07-11: 10 mL
  Filled 2022-07-11: qty 20

## 2022-07-11 NOTE — Discharge Instructions (Addendum)
Please take your antibiotics as prescribed. Take tylenol/ibuprofen every 6 hours, use warm compresses for pain. I recommend close follow-up with PCP for reevaluation.  Please do not hesitate to return to emergency department if worrisome signs symptoms we discussed become apparent.

## 2022-07-11 NOTE — ED Provider Notes (Signed)
[place] [facility] Provider Note   CSN: [id number] Arrival date & time: 07/11/22  4098     History  Chief Complaint  Patient presents with   Dental Pain    Brendan Caldwell is a 72 y.o. male past medical history of hyperlipidemia presents today for evaluation of dental pain.  Patient complains of right lower dental pain since Wednesday.  He was here on Thursday, was given penicillin and has been taking the medication for 3-day with no relief.  States the pain and swelling has gotten worse in his lower jaw and now radiating down to his right-sided neck.  He denies any trouble swallowing, breathing, speaking.  Patient has not contacted any dentist for an appointment.  States he thinks the penicillin will work so he did not call anyone.  Stated he will call someone tomorrow.  States he has been taking ibuprofen with no relief.   Dental Pain     Past Medical History:  Diagnosis Date   Back pain    Hernia, inguinal    Hyperlipidemia    Past Surgical History:  Procedure Laterality Date   FINGER SURGERY     HERNIA REPAIR       Home Medications Prior to Admission medications   Medication Sig Start Date End Date Taking? Authorizing Provider  atorvastatin (LIPITOR) 20 MG tablet TAKE 1 TABLET BY MOUTH ONCE DAILY 09/19/19 09/18/20  Andreas Blower., MD  benzonatate (TESSALON) 100 MG capsule Take 1 capsule (100 mg total) by mouth 3 (three) times daily as needed for cough. 07/09/18   Loren Racer, MD  benzonatate (TESSALON) 100 MG capsule Take 1 capsule (100 mg total) by mouth 3 (three) times daily as needed for cough for up to 7 days. 07/06/22     buPROPion (WELLBUTRIN XL) 150 MG 24 hr tablet Take 1 tablet (150 mg total) by mouth daily. 04/24/22   Sarina Ill, DO  cephALEXin (KEFLEX) 500 MG capsule Take 1 capsule (500 mg total) by mouth 3 (three) times daily. 06/25/16   Loren Racer, MD  clindamycin (CLEOCIN) 300 MG capsule Take 1 capsule  (300 mg total) by mouth 4 (four) times daily. X 7 days 06/11/18   Geoffery Lyons, MD  HYDROcodone-acetaminophen (NORCO) 5-325 MG tablet Take 1-2 tablets by mouth every 6 (six) hours as needed. 06/11/18   Geoffery Lyons, MD  mirtazapine (REMERON) 15 MG tablet Take 1 tablet (15 mg total) by mouth at bedtime. 04/23/22   Sarina Ill, DO  penicillin v potassium (VEETID) 500 MG tablet Take 1 tablet (500 mg total) by mouth 3 (three) times daily for 7 days 12/28/21     penicillin v potassium (VEETID) 500 MG tablet Take 1 tablet (500 mg total) by mouth 4 (four) times daily for 7 days. 07/09/22 07/16/22  Palumbo, April, MD  permethrin [place]) 5 % cream Apply to affected area once 06/25/16   Loren Racer, MD      Allergies    Patient has no known allergies.    Review of Systems   Review of Systems Negative except as per HPI.  Physical Exam Updated Vital Signs BP (!) 147/75 (BP Location: Right Arm)   Pulse 97   Temp 97.9 F (36.6 C) (Oral)   Resp 18   Ht 5\' 7"  (1.702 m)   Wt 64.4 kg   SpO2 98%   BMI 22.24 kg/m  Physical Exam Vitals and nursing note reviewed.  Constitutional:      Appearance:  Normal appearance.  HENT:     Head: Normocephalic and atraumatic.     Mouth/Throat:     Mouth: Mucous membranes are moist.     Comments: Bad dentition. Missing multiple teeth in the R lower jaw. Dental caries and tartar.  Fluid pocket with fluctuance and surrounding erythema in the right lower jaw. Eyes:     General: No scleral icterus. Cardiovascular:     Rate and Rhythm: Normal rate and regular rhythm.     Pulses: Normal pulses.     Heart sounds: Normal heart sounds.  Pulmonary:     Effort: Pulmonary effort is normal.     Breath sounds: Normal breath sounds.  Abdominal:     General: Abdomen is flat.     Palpations: Abdomen is soft.     Tenderness: There is no abdominal tenderness.  Musculoskeletal:        General: No deformity.  Skin:    General: Skin is warm.     Findings: No  rash.  Neurological:     General: No focal deficit present.     Mental Status: He is alert.  Psychiatric:        Mood and Affect: Mood normal.     ED Results / Procedures / Treatments   Labs (all labs ordered are listed, but only abnormal results are displayed) Labs Reviewed - No data to display  EKG None  Radiology No results found.  Procedures Procedures    Medications Ordered in ED Medications - No data to display  ED Course/ Medical Decision Making/ A&P                             Medical Decision Making Risk Prescription drug management.   This patient presents to the ED for dental pain, this involves an extensive number of treatment options, and is a complaint that carries with a high risk of complications and morbidity.  The differential diagnosis includes periapical abscess, periapical dental abscess, peritonsillar abscess, tooth decay, dental fracture, dental avulsion, factious etiology.  This is not an exhaustive list.  Problem list/ ED course/ Critical interventions/ Medical management: HPI: See above Vital signs within normal range and stable throughout visit. Laboratory/imaging studies significant for: See above. On physical examination, patient is afebrile and appears in no acute distress.  This patient presents with a painful fluid pocket with fluctuance and surrounding induration and erythema on his right lower jaw concerning for dental abscess.  I&D was performed by my attending Dr. Fredderick Phenix. The abscess was anesthetized with lidocaine with epi and then I&D was performed with inoculation and purulence was expressed.  Patient is not immunocompromise.  He tolerated the procedure well.  Patient to be discharged home with clindamycin with follow-up with his dentist. I have reviewed the patient home medicines and have made adjustments as needed.  Cardiac monitoring/EKG: The patient was maintained on a cardiac monitor.  I personally reviewed and interpreted the  cardiac monitor which showed an underlying rhythm of: sinus rhythm.  Additional history obtained: External records from outside source obtained and reviewed including: Chart review including previous notes, labs, imaging.  Consultations obtained:  Disposition Continued outpatient therapy. Follow-up with dentist recommended for reevaluation of symptoms. Treatment plan discussed with patient.  Pt acknowledged understanding was agreeable to the plan. Worrisome signs and symptoms were discussed with patient, and patient acknowledged understanding to return to the ED if they noticed these signs and symptoms. Patient was stable upon  discharge.   This chart was dictated using voice recognition software.  Despite best efforts to proofread,  errors can occur which can change the documentation meaning.          Final Clinical Impression(s) / ED Diagnoses Final diagnoses:  Dental abscess    Rx / DC Orders ED Discharge Orders          Ordered    clindamycin (CLEOCIN) 150 MG capsule  3 times daily        07/11/22 1037    oxyCODONE-acetaminophen (PERCOCET/ROXICET) 5-325 MG tablet  Every 6 hours PRN        07/11/22 1038              Jeanelle Malling, [place] 07/11/22 1043    Rolan Bucco, MD 07/11/22 1349

## 2022-07-11 NOTE — ED Notes (Signed)
ED Provider at bedside. 

## 2022-07-11 NOTE — ED Provider Notes (Signed)
  [place] [facility] [place]   CSN: [id number] Arrival date & time: 07/11/22  6644      Radiology No results found.  Procedures .Marland KitchenIncision and Drainage  Date/Time: 07/11/2022 11:30 AM  Performed by: Rolan Bucco, MD Authorized by: Rolan Bucco, MD   Consent:    Consent obtained:  Verbal   Consent given by:  Patient   Risks discussed:  Bleeding, incomplete drainage and pain   Alternatives discussed:  No treatment Universal protocol:    Patient identity confirmed:  Verbally with patient Location:    Type:  Abscess   Size:  2cm   Location:  Mouth   Mouth location:  Alveolar process Sedation:    Sedation type:  None Anesthesia:    Anesthesia method:  Local infiltration   Local anesthetic:  Lidocaine 2% WITH epi Procedure type:    Complexity:  Simple Procedure details:    Ultrasound guidance: no     Needle aspiration: no     Incision types:  Single straight   Incision and drainage depth: through mucosa.   Wound management:  Probed and deloculated   Drainage:  Purulent   Drainage amount:  Moderate   Wound treatment:  Wound left open   Packing materials:  None Post-procedure details:    Procedure completion:  Tolerated well, no immediate complications      ED Course/ Medical Decision Making/ A&P   {   Final Clinical Impression(s) / ED Diagnoses Final diagnoses:  Dental abscess    Rx / DC Orders ED Discharge Orders          Ordered    clindamycin (CLEOCIN) 150 MG capsule  3 times daily        07/11/22 1037    oxyCODONE-acetaminophen (PERCOCET/ROXICET) 5-325 MG tablet  Every 6 hours PRN        07/11/22 1038              Rolan Bucco, MD 07/11/22 1355

## 2022-07-11 NOTE — ED Triage Notes (Signed)
C/o right lower dental pain since Wednesday. Seen here Thursday, started on penicillin. Hasn't seen dentist yet. States swelling is worsening. Denies trouble swallowing, however feels swelling going down throat now.

## 2022-07-20 ENCOUNTER — Other Ambulatory Visit (HOSPITAL_BASED_OUTPATIENT_CLINIC_OR_DEPARTMENT_OTHER): Payer: Self-pay

## 2022-07-20 MED ORDER — ONDANSETRON 4 MG PO TBDP
4.0000 mg | ORAL_TABLET | Freq: Two times a day (BID) | ORAL | 0 refills | Status: AC | PRN
  Filled 2022-07-20: qty 4, 2d supply, fill #0

## 2022-07-30 ENCOUNTER — Other Ambulatory Visit (HOSPITAL_BASED_OUTPATIENT_CLINIC_OR_DEPARTMENT_OTHER): Payer: Self-pay

## 2022-08-10 ENCOUNTER — Other Ambulatory Visit (HOSPITAL_BASED_OUTPATIENT_CLINIC_OR_DEPARTMENT_OTHER): Payer: Self-pay

## 2022-08-10 MED ORDER — PREDNISONE 10 MG PO TABS
ORAL_TABLET | ORAL | 0 refills | Status: AC
  Filled 2022-08-10: qty 30, 10d supply, fill #0

## 2022-08-25 ENCOUNTER — Other Ambulatory Visit (HOSPITAL_BASED_OUTPATIENT_CLINIC_OR_DEPARTMENT_OTHER): Payer: Self-pay

## 2022-08-27 ENCOUNTER — Other Ambulatory Visit (HOSPITAL_BASED_OUTPATIENT_CLINIC_OR_DEPARTMENT_OTHER): Payer: Self-pay

## 2022-08-27 MED ORDER — ATORVASTATIN CALCIUM 20 MG PO TABS
20.0000 mg | ORAL_TABLET | Freq: Every day | ORAL | 0 refills | Status: AC
  Filled 2022-08-27: qty 90, 90d supply, fill #0

## 2022-11-09 ENCOUNTER — Other Ambulatory Visit (HOSPITAL_BASED_OUTPATIENT_CLINIC_OR_DEPARTMENT_OTHER): Payer: Self-pay

## 2022-11-09 MED ORDER — OFLOXACIN 0.3 % OP SOLN
1.0000 [drp] | Freq: Four times a day (QID) | OPHTHALMIC | 0 refills | Status: AC
  Filled 2022-11-09: qty 5, 7d supply, fill #0

## 2022-12-06 ENCOUNTER — Other Ambulatory Visit (HOSPITAL_BASED_OUTPATIENT_CLINIC_OR_DEPARTMENT_OTHER): Payer: Self-pay

## 2022-12-06 MED ORDER — ATORVASTATIN CALCIUM 20 MG PO TABS
20.0000 mg | ORAL_TABLET | Freq: Every day | ORAL | 1 refills | Status: DC
  Filled 2022-12-06: qty 90, 90d supply, fill #0
  Filled 2023-03-31: qty 90, 90d supply, fill #1

## 2022-12-07 ENCOUNTER — Other Ambulatory Visit (HOSPITAL_BASED_OUTPATIENT_CLINIC_OR_DEPARTMENT_OTHER): Payer: Self-pay

## 2023-02-24 ENCOUNTER — Other Ambulatory Visit (HOSPITAL_BASED_OUTPATIENT_CLINIC_OR_DEPARTMENT_OTHER): Payer: Self-pay

## 2023-02-24 MED ORDER — OFLOXACIN 0.3 % OT SOLN
10.0000 [drp] | Freq: Every day | OTIC | 0 refills | Status: AC
  Filled 2023-02-24: qty 5, 7d supply, fill #0

## 2023-03-31 ENCOUNTER — Other Ambulatory Visit (HOSPITAL_BASED_OUTPATIENT_CLINIC_OR_DEPARTMENT_OTHER): Payer: Self-pay

## 2023-06-15 ENCOUNTER — Other Ambulatory Visit (HOSPITAL_BASED_OUTPATIENT_CLINIC_OR_DEPARTMENT_OTHER): Payer: Self-pay

## 2023-06-15 MED ORDER — ONDANSETRON 4 MG PO TBDP
ORAL_TABLET | ORAL | 0 refills | Status: AC
  Filled 2023-06-15: qty 6, 3d supply, fill #0

## 2023-06-15 MED ORDER — BENZONATATE 100 MG PO CAPS
100.0000 mg | ORAL_CAPSULE | Freq: Three times a day (TID) | ORAL | 0 refills | Status: AC
  Filled 2023-06-15: qty 20, 7d supply, fill #0

## 2023-06-29 ENCOUNTER — Other Ambulatory Visit (HOSPITAL_BASED_OUTPATIENT_CLINIC_OR_DEPARTMENT_OTHER): Payer: Self-pay

## 2023-07-08 ENCOUNTER — Other Ambulatory Visit (HOSPITAL_BASED_OUTPATIENT_CLINIC_OR_DEPARTMENT_OTHER): Payer: Self-pay

## 2023-07-08 MED ORDER — ATORVASTATIN CALCIUM 20 MG PO TABS
20.0000 mg | ORAL_TABLET | Freq: Every day | ORAL | 1 refills | Status: DC
  Filled 2023-07-08: qty 30, 30d supply, fill #0
  Filled 2023-08-12: qty 30, 30d supply, fill #1
  Filled 2023-09-15: qty 30, 30d supply, fill #2
  Filled 2023-10-21: qty 30, 30d supply, fill #3
  Filled 2023-12-01: qty 30, 30d supply, fill #4
  Filled 2024-01-05: qty 30, 30d supply, fill #5

## 2023-08-12 ENCOUNTER — Other Ambulatory Visit (HOSPITAL_BASED_OUTPATIENT_CLINIC_OR_DEPARTMENT_OTHER): Payer: Self-pay

## 2023-09-15 ENCOUNTER — Other Ambulatory Visit (HOSPITAL_BASED_OUTPATIENT_CLINIC_OR_DEPARTMENT_OTHER): Payer: Self-pay

## 2023-10-21 ENCOUNTER — Other Ambulatory Visit (HOSPITAL_BASED_OUTPATIENT_CLINIC_OR_DEPARTMENT_OTHER): Payer: Self-pay

## 2023-12-01 ENCOUNTER — Other Ambulatory Visit (HOSPITAL_BASED_OUTPATIENT_CLINIC_OR_DEPARTMENT_OTHER): Payer: Self-pay

## 2024-01-05 ENCOUNTER — Other Ambulatory Visit (HOSPITAL_BASED_OUTPATIENT_CLINIC_OR_DEPARTMENT_OTHER): Payer: Self-pay

## 2024-02-03 ENCOUNTER — Other Ambulatory Visit (HOSPITAL_BASED_OUTPATIENT_CLINIC_OR_DEPARTMENT_OTHER): Payer: Self-pay

## 2024-02-03 MED ORDER — ATORVASTATIN CALCIUM 20 MG PO TABS
20.0000 mg | ORAL_TABLET | Freq: Every day | ORAL | 1 refills | Status: AC
  Filled 2024-02-03: qty 90, 90d supply, fill #0

## 2024-02-14 ENCOUNTER — Other Ambulatory Visit (HOSPITAL_BASED_OUTPATIENT_CLINIC_OR_DEPARTMENT_OTHER): Payer: Self-pay

## 2024-02-14 MED ORDER — ONDANSETRON 4 MG PO TBDP
4.0000 mg | ORAL_TABLET | Freq: Three times a day (TID) | ORAL | 0 refills | Status: AC | PRN
  Filled 2024-02-14: qty 10, 4d supply, fill #0

## 2024-03-01 ENCOUNTER — Other Ambulatory Visit (HOSPITAL_BASED_OUTPATIENT_CLINIC_OR_DEPARTMENT_OTHER): Payer: Self-pay
